# Patient Record
Sex: Male | Born: 1955 | Race: Black or African American | Hispanic: No | Marital: Single | Smoking: Former smoker
Health system: Southern US, Community
[De-identification: ages and names within clinical notes are randomized; demographics above are authoritative.]

## PROBLEM LIST (undated history)

## (undated) ENCOUNTER — Telehealth

## (undated) ENCOUNTER — Ambulatory Visit
Payer: PRIVATE HEALTH INSURANCE | Attending: Student in an Organized Health Care Education/Training Program | Primary: Student in an Organized Health Care Education/Training Program

## (undated) ENCOUNTER — Encounter
Attending: Student in an Organized Health Care Education/Training Program | Primary: Student in an Organized Health Care Education/Training Program

## (undated) ENCOUNTER — Encounter

## (undated) ENCOUNTER — Ambulatory Visit

## (undated) ENCOUNTER — Telehealth
Attending: Pharmacist Clinician (PhC)/ Clinical Pharmacy Specialist | Primary: Pharmacist Clinician (PhC)/ Clinical Pharmacy Specialist

## (undated) ENCOUNTER — Telehealth
Attending: Student in an Organized Health Care Education/Training Program | Primary: Student in an Organized Health Care Education/Training Program

## (undated) ENCOUNTER — Ambulatory Visit: Payer: PRIVATE HEALTH INSURANCE

## (undated) ENCOUNTER — Ambulatory Visit: Attending: Pharmacist | Primary: Pharmacist

## (undated) ENCOUNTER — Encounter
Attending: Pharmacist Clinician (PhC)/ Clinical Pharmacy Specialist | Primary: Pharmacist Clinician (PhC)/ Clinical Pharmacy Specialist

## (undated) ENCOUNTER — Ambulatory Visit: Payer: MEDICARE

## (undated) DIAGNOSIS — R569 Unspecified convulsions: Secondary | ICD-10-CM

---

## 1999-06-15 ENCOUNTER — Emergency Department (HOSPITAL_COMMUNITY): Admission: EM | Admit: 1999-06-15 | Discharge: 1999-06-15 | Payer: Self-pay | Admitting: Emergency Medicine

## 1999-07-27 ENCOUNTER — Encounter: Payer: Self-pay | Admitting: Emergency Medicine

## 1999-07-27 ENCOUNTER — Emergency Department (HOSPITAL_COMMUNITY): Admission: EM | Admit: 1999-07-27 | Discharge: 1999-07-27 | Payer: Self-pay | Admitting: Emergency Medicine

## 1999-11-28 ENCOUNTER — Encounter: Payer: Self-pay | Admitting: Emergency Medicine

## 1999-11-28 ENCOUNTER — Emergency Department (HOSPITAL_COMMUNITY): Admission: EM | Admit: 1999-11-28 | Discharge: 1999-11-28 | Payer: Self-pay | Admitting: Emergency Medicine

## 1999-11-29 ENCOUNTER — Inpatient Hospital Stay (HOSPITAL_COMMUNITY): Admission: EM | Admit: 1999-11-29 | Discharge: 1999-11-30 | Payer: Self-pay | Admitting: *Deleted

## 2001-10-21 ENCOUNTER — Inpatient Hospital Stay (HOSPITAL_COMMUNITY): Admission: EM | Admit: 2001-10-21 | Discharge: 2001-10-25 | Payer: Self-pay | Admitting: Psychiatry

## 2008-03-18 ENCOUNTER — Emergency Department (HOSPITAL_COMMUNITY): Admission: EM | Admit: 2008-03-18 | Discharge: 2008-03-18 | Payer: Self-pay | Admitting: Emergency Medicine

## 2008-03-24 ENCOUNTER — Emergency Department (HOSPITAL_COMMUNITY): Admission: EM | Admit: 2008-03-24 | Discharge: 2008-03-24 | Payer: Self-pay | Admitting: Emergency Medicine

## 2008-04-08 ENCOUNTER — Inpatient Hospital Stay (HOSPITAL_COMMUNITY): Admission: EM | Admit: 2008-04-08 | Discharge: 2008-04-12 | Payer: Self-pay | Admitting: Emergency Medicine

## 2008-11-12 ENCOUNTER — Emergency Department (HOSPITAL_COMMUNITY): Admission: EM | Admit: 2008-11-12 | Discharge: 2008-11-12 | Payer: Self-pay | Admitting: Emergency Medicine

## 2008-11-29 ENCOUNTER — Emergency Department (HOSPITAL_COMMUNITY): Admission: EM | Admit: 2008-11-29 | Discharge: 2008-11-29 | Payer: Self-pay | Admitting: Emergency Medicine

## 2009-06-18 ENCOUNTER — Emergency Department (HOSPITAL_COMMUNITY): Admission: EM | Admit: 2009-06-18 | Discharge: 2009-06-18 | Payer: Self-pay | Admitting: Emergency Medicine

## 2009-08-31 ENCOUNTER — Emergency Department (HOSPITAL_COMMUNITY): Admission: EM | Admit: 2009-08-31 | Discharge: 2009-08-31 | Payer: Self-pay | Admitting: Emergency Medicine

## 2009-12-21 ENCOUNTER — Emergency Department (HOSPITAL_COMMUNITY): Admission: EM | Admit: 2009-12-21 | Discharge: 2009-12-21 | Payer: Self-pay | Admitting: Emergency Medicine

## 2010-06-27 DEATH — deceased

## 2010-08-11 LAB — ETHANOL: Alcohol, Ethyl (B): 189 mg/dL — ABNORMAL HIGH (ref 0–10)

## 2010-08-11 LAB — POCT I-STAT, CHEM 8
Glucose, Bld: 93 mg/dL (ref 70–99)
HCT: 44 % (ref 39.0–52.0)
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
## 2010-10-09 NOTE — Consult Note (Signed)
NAME:  TINY, RIETZ NO.:  [id number]   MEDICAL RECORD NO.:  [id number]          PATIENT TYPE:  INP   LOCATION:  1515                         FACILITY:  [facility]   PHYSICIAN:  Antonietta Breach, M.D.  DATE OF BIRTH:  [birth year]   DATE OF CONSULTATION:  04/11/2008  DATE OF DISCHARGE:                                 CONSULTATION   Mr. Vondrak is no longer having disorientation.  He is cooperative.  His mood and interests are intact.  Thought process is logical,  coherent, goal-directed.  Thought content with no thoughts of harming  himself.  No thoughts of harming others.  No delusions.  No  hallucinations.  He is wanting to leave the hospital.  He is not wanting  any psychiatric followup and he does have the ability to make a  consistent choice to differentiate between his options and their  associated risks versus benefits.  He can appreciate his condition and  he can reason well.  He does have the capacity to choose his discharge  environment.   He is not at risk to harm himself or others.   He does agree to call 9-1-1 for any thoughts of harming himself,  thoughts of harming others or distress.   ASSESSMENT:  1. Alcohol dependence.  2. Delirium is resolved.  3. Mr. Pettet is psychiatrically clear for discharge.  4. He agrees to call emergency services for any thoughts of harming      himself, thoughts of harming others or distress.  5. As he requests, the undersigned will ask the social worker to help      the patient find a place of residence.   If he changes his mind about alcohol rehab, would ask the social worker  to help facilitate his entry into a chemical dependence rehabilitation  program.      Antonietta Breach, M.D.  Electronically Signed     JW/MEDQ  D:  04/12/2008  T:  04/12/2008  Job:  [id number]
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
## 2010-10-12 NOTE — Discharge Summary (Signed)
[facility]  Patient:    Albert Ellis, Albert Ellis Visit Number: [id number] MRN: [id number]          Service Type: PSY Location: 300 0301 02 Attending Physician:  Rachael Fee Dictated by:   Reymundo Poll Dub Mikes, M.D. Admit Date:  10/21/2001 Discharge Date: 10/25/2001                             Discharge Summary  CHIEF COMPLAINT AND PRESENT ILLNESS:  This was the first admission to [facility] for this 55 year old male admitted due to depression and suicidal ideation.  History of alcohol abuse.  Long history of alcohol and cocaine use.  Feeling very hopeless, worthless, suicidal thoughts to walk into traffic, homicidal ideation towards no one in particular.  Positive auditory hallucinations for the past three months.  Positive visual hallucinations, seeing two guys.  None currently.  Reports decreased sleep.  Walks in the streets at night.  Has been homeless since December 2002.  Decreased appetite with 60-80 pound weight loss.  Smoking crack cocaine since he was in his 30s.  PAST PSYCHIATRIC HISTORY:  First time at [facility].  No previous treatment.  ALCOHOL/DRUG HISTORY:  Smokes cigarettes.  Started drinking when he was 17. Been drinking beer or wine, up to eight 40-ounce beers per day.  Drinks all day long.  History of blackouts.  No seizures.  Smoking marijuana and occasionally crack cocaine.  MEDICAL HISTORY:  Noncontributory.  PHYSICAL EXAMINATION:  Performed and failed to show any acute findings.  MENTAL STATUS EXAMINATION:  Well-nourished, alert, cooperative male casually dressed, somewhat unkempt.  Legs are moving during the interview.  Good eye contact.  Speech is clear, concrete response.  Mood is depressed.  Affect is depressed and anxious.  Thought processes are coherent.  No evidence of psychosis.  No auditory or visual hallucinations.  Cognition well-preserved.  ADMISSION DIAGNOSES: Axis I:    1. Alcohol  dependence.            2. Cocaine and marijuana abuse; rule out dependence.            3. Depressive disorder not otherwise specified.            4. Psychotic disorder not otherwise specified. Axis II:   No diagnosis. Axis III:  No diagnosis. Axis IV:   Moderate. Axis V:    Global Assessment of Functioning upon admission 35; highest Global            Assessment of Functioning in the last year 60.  LABORATORY DATA:  Within normal limits except SGOT was 42, SGPT was 47. Thyroid profile was within normal limits.  HOSPITAL COURSE:  He was admitted and started intensive individual and group psychotherapy.  He was detoxified using Librium.  He was given trazodone for sleep and Paxil for the craving.  He was also given Seroquel.  Paxil was increased to 25 mg daily and Seroquel 100 mg at bedtime.  As the hospitalization progressed, he started feeling better.  By October 25, 2001, he was stable, detoxed.  He will go to [facility] in [place].  Follow up with [facility].  Denied any suicidal ideation.  No homicidal ideation.  No active hallucinations.  Discharge was granted.  DISCHARGE DIAGNOSES: Axis I:    1. Alcohol dependence.            2. Cocaine and marijuana abuse.  3. Depressive disorder not otherwise specified. Axis II:   No diagnosis. Axis III:  No diagnosis. Axis IV:   Moderate. Axis V:    Global Assessment of Functioning upon discharge 60.  DISCHARGE MEDICATIONS: 1. Paxil CR 25 mg daily. 2. Seroquel 100 mg at night.  FOLLOW-UP:  [facility]. Dictated by:   Reymundo Poll Dub Mikes, M.D. Attending Physician:  Rachael Fee DD:  12/02/01 TD:  12/02/01 Job: [id number]

## 2010-10-12 NOTE — H&P (Signed)
[facility]  Patient:    Albert Ellis, Albert Ellis                       MRN: [id number] Adm. Date:  [id number] Attending:  Sandi Raveling CC:         ASAP to patients chart                         History and Physical  DATE OF BIRTH:  [birth year].  PROBLEM LIST: 1. Right lower extremity edema; rule out gout. 2. Dehydration. 3. Polysubstance abuse.    a. Alcohol abuse, 120-160 ounces of beer q.d., history of _____ in 1999.    b. Smoking, one pack q.d. x 15 years.    c. Marijuana use and cocaine use.  CHIEF COMPLAINT:  Right lower extremity pain.  HISTORY OF PRESENT ILLNESS:  Mr. Lipkin is a very pleasant 55 year old male who presents with a three-day history of progressive right lower extremity pain, erythema and slight warmth.  The patient was seen in the emergency department on 07/__/01, with signs/symptoms of cellulitis on the right lower extremity, ______ the first MTP, base of the right foot and ankle.  The patient received one shot of Ancef and he was discharged on Keflex.  He could not refill these prescriptions due to being unable to afford these medications.  In the ED, a needle aspirate of the area was obtained, the cultures are pending.  Mr. Edgell returned with worsening signs of pain and cellulitis.  He denies fever, chills, nausea, vomiting, diarrhea, malaise.  He denies chest pain, shortness of breath, PND or orthopnea.  No visual changes.  No presyncopal symptoms.  No abdominal complaints.  PAST MEDICAL HISTORY:  As problem list.  ALLERGIES:  None.  MEDICATIONS:  None.  The patient had two prescriptions for Vicodin and Keflex that he never refilled.  SOCIAL HISTORY:  The patient is divorced.  He has a daughter, age 46.  He works as a [employer]aff[employer].  He drinks as described in problem list.  He uses tobacco, cocaine nd marijuana, as described in the HPI.  FAMILY MEDICAL HISTORY:  Mother had diabetes, hypertension; she had a  stroke and heart attack in her early 56s.  No malignancy in the family.  REVIEW OF SYSTEMS:  As per HPI.  No focal weakness.  The patient describes an excruciating pain in the right lower extremity, starting in the right great toe and radiated up to the mid-tibial region.  PHYSICAL EXAMINATION:  VITAL SIGNS:  Temperature 98.1, blood pressure 137/83, heart rate 75, respirations 20.  HEENT:  Normocephalic, atraumatic.  Nonicteric sclerae.  Conjunctivae within normal limits.  PERRLA.  EOMI.  Funduscopic:  Exam negative for papilledema/hemorrhages. TMs within normal limits.  Slight dry mucous membranes.  Oropharynx clear.  NECK:  Supple; no JVD, no bruits, no adenopathy, no thyromegaly.  LUNGS:  Clear to auscultation bilaterally, without crackles, wheezes.  Good air  movement bilaterally.  CARDIAC:  Regular rate and rhythm without murmurs, rubs, gallops.  Normal S1, S2.  ABDOMEN:  Flat, nontender/nondistended; bowel sounds are present.  No hepato- splenomegaly.  No rebound, no guarding, no masses, no bruits.  ______:  Exam within normal limits.  RECTAL:  Exam deferred.  EXTREMITIES:  The right great toe has erythema extending from the base of the toes up to the ankle and then to the one-third of the distal tibial region in the medial aspect of  the extremity.  There is diffuse tenderness, more excruciating in the  first MTP.  There are area of bunions.  There is a less than 0.5 cm cut in the plantar aspect of the right great toe, without evidence of infection.  Warmth is present.  No evidence of drainage.  There is slight erythema and edema involving the base of the right toe and ankle.  Pulses are 2+ bilaterally.  No clubbing or cyanosis.  NEUROLOGIC:  Alert and oriented x 3.  Strength 5/5 in all extremities.  DTRs 3/5 in all extremities.  Cranial nerves II-XII intact.  Plantar reflexes downgoing bilaterally.  Sensory intact.  Questionable paraesthesias in the right  lower extremity.  LABORATORY DATA:  Pending.  ASSESSMENT/PLAN: 1. Right lower extremity cellulitis. Given the sign/symptom complex associated with the history of presentation, bacterial cellulitis is possible.  The basis for this admission is the fact the  patient failed outpatient therapy because he could not afford the antibiotics prescribed in the emergency department last night.  Also, given the location of  this infection, the micro-organisms involved in this type of cellulitis tend to be a mixed flora (_______________)  Also, about 10-12% of the cases involving the feet cellulitis are associated with Pseudomonal infection.  Finally, acute gout  with superimposed bacterial infection is possible.  Notice that the x-ray of extremities shows no ______; DJD is present in the right first MTP.  Will admit the patient to receive intravenous antibiotic therapy, empiric colchicine also will be started.  Right lower extremity will be elevated.  Pain  control will be provided with IV Demerol.  Blood cultures and lab data obtained in the emergency department still are pending.  If no clinical improvement, the right first MTP will be ______ under fluoroscopy to rule out acute gout versus septic  joint. 2. Dehydration. The physical examination is consistent with a mild degree of dehydration. Intravenous hydration therapy has been started in the ED.  Will follow the patients dehydration clinically.  Daily weights and fluid balance will be monitored throughout this hospital stay. 3. Polysubstance abuse. Given the amount of drinking this patient describes, and the timing of the last  drink (two days ago), alcohol withdrawal syndrome is an obvious risk.  Will start the patient on Librium, multivitamins and Ativan will be used intravenously as needed for agitation.  Information about AA was given to the patient.  I also spent about ten minutes talking about smoking cessation; the  patient declined help with patch or medication, like Wellbutrin.  Also, ______ information will be given prior to discharge to ______ rehab at this facility. DD:  11/29/99  TD:  11/29/99 Job: [id number]

## 2010-10-12 NOTE — H&P (Signed)
[facility]  Patient:    Albert Ellis, Albert Ellis Visit Number: [id number] MRN: [id number]          Service Type: PSY Location: 300 0301 02 Attending Physician:  Rachael Fee Dictated by:   Candi Leash. Orsini, N.P. Admit Date:  10/21/2001                     Psychiatric Admission Assessment  IDENTIFYING  55 year old single African-American male voluntarily admitted for depression, suicidal ideation, polysubstance abuse.  HISTORY OF PRESENT ILLNESS:  The patient presents with a history of alcohol abuse, long history of alcohol and cocaine abuse, feeling very hopeless and worthless, with suicidal thoughts to walk into traffic, and homicidal ideation towards no one in particular.  Reporting positive auditory hallucinations for the past 3 months, positive visual hallucinations, seeing "2 guys," but none currently.  He reports decreased sleep.  He states he walks the streets at night.  He has been homeless since 06-05-2001.  Also decreased appetite with a 60-80 pound weight loss.  He also admits that he has been smoking crack cocaine since he has been in his 30s.  He is currently reporting cravings at present and states he is motivated to get better.  PAST PSYCHIATRIC HISTORY:  First hospitalization at [facility], no other hospitalizations. no history of detox, no outpatient treatment, no history of a suicide attempt.  SOCIAL HISTORY:  He is a 55 year old single African-American male, divorced for over 10 years.  He has a 72 year old daughter.  He is homeless since 2002-06-05 after his mother died.  He has completed the 11/04/2022 grade.  FAMILY HISTORY:  None.  ALCOHOL DRUG HISTORY:  Patient smokes cigarettes.  He started drinking at the age of 14.  He has been drinking beer or wine, up to eight 40-ounce beers per day.  States he drinks all day long.  His last drink was 3 a.m. on Wednesday morning.  He has a history of blackouts with no  seizures, smoking marijuana and occasional crack cocaine since he has been in his 30s.  PAST MEDICAL HISTORY:  Primary care Marcelis Wissner is none.  Medical problems are none.  Medications are none.  He has never been on an antidepressant.  DRUG ALLERGIES:  No known allergies.  PHYSICAL EXAMINATION:  The patient is 179 pounds.  He is 5 feet 10 inches tall.  VITAL SIGNS:  97.7, 66 heart rate, 20 respirations, blood pressure 122/84.  GENERAL APPEARANCE:    Patient is a 55 year old African-American male in no acute distress.  He is well developed, appears his stated age, somewhat unkempt, alert and cooperative.  HEAD:  Normocephalic.  His hair is short, clean,  equally distributed.  EYES: His EOMs are intact bilaterally.  MOUTH:  His external ear canals are patent. No sinus tenderness or nasal discharge.  Tongue protrudes midline without tremor.  Mucosa is moist with fair dentition.  The patient has a lot of stain to his lower teeth.  He is edentulous in his upper teeth.  NECK:  Supple, no JVD, negative lymphadenopathy.  CHEST:  Clear to auscultation.  No adventitious sounds.  No cough.  HEART:  Regular rate and rhythm, without murmurs, gallops or rubs.  Carotid pulses are equal and adequate.  ABDOMEN:  Soft, nontender abdomen.  No CVA tenderness.  MUSCULOSKELETAL:  No joint swelling or deformity.  Good range of motion. Muscle strength and tone is equal bilaterally.  SKIN:  Warm and dry.  Strong bilateral radial pulses.  Nail beds are pink, clean with good capillary refill.  No rashes or lacerations noted.  NEUROLOGIC:  Cranial nerves are grossly intact.  Good grip strength bilaterally.  No involuntary movements.  Cerebellar functions were intact, with heel-to-shin, normal alternating movements.  LABORATORY DATA:  Alcohol level less than 5. SGOT is elevated at 42.  SGPT is 47.  CBC:  MCVs are low at 74.  RDWs are elevated at 16.  MENTAL STATUS EXAMINATION:  He is an alert,  middle-aged, cooperative African-American male, casually dressed, somewhat unkempt.  Legs are moving during the interview.  Good eye contact.  Speech is clear, concrete response. Mood is depressed, affect is depressed and anxious.  Thought process are coherent.  No evidence of psychosis.  No auditory or visual hallucinations. No suicidal or homicidal ideations.  Cognitive function is intact.  Memory is fair.  Judgment and insight is fair.  Appears to have somewhat of a limited intellectual ability.  ADMISSION DIAGNOSES: Axis I:    1. Depression not otherwise specified.            2. Alcohol abuse, rule out dependence.            3. Cocaine abuse, rule out dependence. Axis II:   Deferred. Axis III:  None. Axis IV:   Problems with primary support group, occupation, housing, other            psychosocial problems. Axis V:    Current 35, estimated this past year 52.  INITIAL PLAN OF CARE:  Voluntary admission for depression, suicidal ideation, alcohol abuse, and polysubstance abuse.  Contract for safety, check every 15 minutes.  Will initiate the low-dose Librium protocol, have trazodone available for sleep.  We will initiate Paxil for depression and anxiety. Risks and benefits were discussed with patient, patient agrees.  Will encourage fluids, will detox safely.  Goal is to stabilize mood and thinking so patient can be safe, to be medication compliant, to remain alcohol and drug free, to follow up with mental health and AA/NA meetings.  Case worker is to look at housing arrangements after discharge.  TENTATIVE LENGTH OF STAY:  4-6 days. Dictated by:   Candi Leash. Orsini, N.P. Attending Physician:  Rachael Fee DD:  10/22/01 TD:  10/24/01 Job: [id number]

## 2011-02-26 LAB — CBC
MCHC: 32.3
MCV: 74.7 — ABNORMAL LOW
Platelets: 193
Platelets: 247
RDW: 16 — ABNORMAL HIGH
RDW: 16.1 — ABNORMAL HIGH
WBC: 6.7

## 2011-02-26 LAB — CROSSMATCH: ABO/RH(D): A POS

## 2011-02-26 LAB — BASIC METABOLIC PANEL
BUN: 6
CO2: 29
Calcium: 9.5
Chloride: 101
Creatinine, Ser: 0.95
GFR calc Af Amer: >60
Glucose, Bld: 103 — ABNORMAL HIGH

## 2011-02-26 LAB — HEPATITIS C ANTIBODY: HCV Ab: REACTIVE — AB

## 2011-02-26 LAB — COMPREHENSIVE METABOLIC PANEL
BUN: 12
CO2: 25
GFR calc non Af Amer: >60
Glucose, Bld: 86
Potassium: 3.8

## 2011-02-26 LAB — POCT I-STAT, CHEM 8
BUN: 16
Calcium, Ion: 1.09 — ABNORMAL LOW
Chloride: 109
Glucose, Bld: 79
HCT: 37 — ABNORMAL LOW
Hemoglobin: 12.6 — ABNORMAL LOW
TCO2: 23

## 2011-02-26 LAB — PROTIME-INR: Prothrombin Time: 14.1

## 2011-02-26 LAB — HEPATIC FUNCTION PANEL
ALT: 40
Albumin: 2.8 — ABNORMAL LOW
Albumin: 3.4 — ABNORMAL LOW
Alkaline Phosphatase: 51
Total Protein: 5.7 — ABNORMAL LOW
Total Protein: 7.5

## 2011-02-26 LAB — ABO/RH: ABO/RH(D): A POS

## 2011-02-26 LAB — ETHANOL: Alcohol, Ethyl (B): 30 — ABNORMAL HIGH

## 2011-02-26 LAB — MAGNESIUM: Magnesium: 1.6

## 2011-02-26 LAB — RAPID URINE DRUG SCREEN, HOSP PERFORMED: Opiates: NOT DETECTED

## 2011-02-26 LAB — HIV ANTIBODY (ROUTINE TESTING W REFLEX): HIV: NONREACTIVE

## 2018-02-16 NOTE — Congregational Nurse Program (Signed)
Client requesting resources.  Referred to [facility] intern to assist with obtaining ID, intake to see Lavinia SharpsMary Ann Placey NP and Social Services for his SSI

## 2018-10-11 ENCOUNTER — Emergency Department (HOSPITAL_COMMUNITY)
Admission: EM | Admit: 2018-10-11 | Discharge: 2018-10-11 | Attending: Emergency Medicine | Admitting: Emergency Medicine

## 2018-10-11 ENCOUNTER — Encounter (HOSPITAL_COMMUNITY): Payer: Self-pay

## 2018-10-11 ENCOUNTER — Other Ambulatory Visit: Payer: Self-pay

## 2018-10-11 ENCOUNTER — Emergency Department (HOSPITAL_COMMUNITY)

## 2018-10-11 DIAGNOSIS — M549 Dorsalgia, unspecified: Secondary | ICD-10-CM | POA: Insufficient documentation

## 2018-10-11 DIAGNOSIS — Y92149 Unspecified place in prison as the place of occurrence of the external cause: Secondary | ICD-10-CM | POA: Diagnosis not present

## 2018-10-11 DIAGNOSIS — Z23 Encounter for immunization: Secondary | ICD-10-CM | POA: Diagnosis not present

## 2018-10-11 DIAGNOSIS — M25512 Pain in left shoulder: Secondary | ICD-10-CM | POA: Insufficient documentation

## 2018-10-11 DIAGNOSIS — Z79899 Other long term (current) drug therapy: Secondary | ICD-10-CM | POA: Diagnosis not present

## 2018-10-11 DIAGNOSIS — S0181XA Laceration without foreign body of other part of head, initial encounter: Secondary | ICD-10-CM

## 2018-10-11 DIAGNOSIS — Y9389 Activity, other specified: Secondary | ICD-10-CM | POA: Diagnosis not present

## 2018-10-11 DIAGNOSIS — W19XXXA Unspecified fall, initial encounter: Secondary | ICD-10-CM

## 2018-10-11 DIAGNOSIS — W01198A Fall on same level from slipping, tripping and stumbling with subsequent striking against other object, initial encounter: Secondary | ICD-10-CM | POA: Diagnosis not present

## 2018-10-11 DIAGNOSIS — Z87891 Personal history of nicotine dependence: Secondary | ICD-10-CM | POA: Diagnosis not present

## 2018-10-11 DIAGNOSIS — Y998 Other external cause status: Secondary | ICD-10-CM | POA: Diagnosis not present

## 2018-10-11 DIAGNOSIS — S0990XA Unspecified injury of head, initial encounter: Secondary | ICD-10-CM | POA: Diagnosis present

## 2018-10-11 DIAGNOSIS — Z8669 Personal history of other diseases of the nervous system and sense organs: Secondary | ICD-10-CM | POA: Insufficient documentation

## 2018-10-11 HISTORY — DX: Unspecified convulsions: R56.9

## 2018-10-11 LAB — CBC WITH DIFFERENTIAL/PLATELET
Abs Immature Granulocytes: 0.02 10*3/uL (ref 0.00–0.07)
Basophils Absolute: 0 10*3/uL (ref 0.0–0.1)
Basophils Relative: 0 %
Eosinophils Absolute: 0 10*3/uL (ref 0.0–0.5)
Eosinophils Relative: 0 %
HCT: 41.9 % (ref 39.0–52.0)
Hemoglobin: 13.1 g/dL (ref 13.0–17.0)
Immature Granulocytes: 0 %
Lymphocytes Relative: 24 %
Lymphs Abs: 1.3 10*3/uL (ref 0.7–4.0)
MCH: 24.3 pg — ABNORMAL LOW (ref 26.0–34.0)
MCHC: 31.3 g/dL (ref 30.0–36.0)
MCV: 77.9 fL — ABNORMAL LOW (ref 80.0–100.0)
Monocytes Absolute: 0.7 10*3/uL (ref 0.1–1.0)
Monocytes Relative: 13 %
Neutro Abs: 3.4 10*3/uL (ref 1.7–7.7)
Neutrophils Relative %: 63 %
Platelets: 111 10*3/uL — ABNORMAL LOW (ref 150–400)
RBC: 5.38 MIL/uL (ref 4.22–5.81)
RDW: 15.1 % (ref 11.5–15.5)
WBC: 5.4 10*3/uL (ref 4.0–10.5)
nRBC: 0 % (ref 0.0–0.2)

## 2018-10-11 LAB — BASIC METABOLIC PANEL
Anion gap: 8 (ref 5–15)
BUN: 18 mg/dL (ref 8–23)
CO2: 28 mmol/L (ref 22–32)
Calcium: 8.8 mg/dL — ABNORMAL LOW (ref 8.9–10.3)
Chloride: 104 mmol/L (ref 98–111)
Creatinine, Ser: 1.11 mg/dL (ref 0.61–1.24)
GFR calc Af Amer: >60 mL/min (ref >60)
GFR calc non Af Amer: >60 mL/min (ref >60)
Glucose, Bld: 114 mg/dL — ABNORMAL HIGH (ref 70–99)
Potassium: 3.3 mmol/L — ABNORMAL LOW (ref 3.5–5.1)
Sodium: 140 mmol/L (ref 135–145)

## 2018-10-11 LAB — VALPROIC ACID LEVEL: Valproic Acid Lvl: 98 ug/mL (ref 50.0–100.0)

## 2018-10-11 LAB — CBG MONITORING, ED: Glucose-Capillary: 97 mg/dL (ref 70–99)

## 2018-10-11 MED ORDER — LIDOCAINE HCL 2 % IJ SOLN
INTRAMUSCULAR | Status: AC
  Administered 2018-10-11: 100 mg
  Filled 2018-10-11: qty 20

## 2018-10-11 MED ORDER — ALUM & MAG HYDROXIDE-SIMETH 200-200-20 MG/5ML PO SUSP
30.0000 mL | Freq: Once | ORAL | Status: AC
  Administered 2018-10-11: 30 mL via ORAL
  Filled 2018-10-11: qty 30

## 2018-10-11 MED ORDER — TETANUS-DIPHTH-ACELL PERTUSSIS 5-2.5-18.5 LF-MCG/0.5 IM SUSP
0.5000 mL | Freq: Once | INTRAMUSCULAR | Status: AC
  Administered 2018-10-11: 0.5 mL via INTRAMUSCULAR
  Filled 2018-10-11: qty 0.5

## 2018-10-11 MED ORDER — LIDOCAINE-EPINEPHRINE-TETRACAINE (LET) SOLUTION
3.0000 mL | Freq: Once | NASAL | Status: DC
  Filled 2018-10-11: qty 3

## 2018-10-11 NOTE — Discharge Instructions (Addendum)
You were seen in the ED after a slip and fall after using the bathroom.   CT head, labs, and other work up in ED was normal. You refused tetanus shot because you just had it.   Stitches need to come out in 7 days  Laceration to the forehead was repair with stitches.  Keep this clean and dry. Wash with clean water and soap, tap dry and apply a thin layer of antibiotic ointment at least twice daily. Monitor for and return for redness, swelling, pus, fever.

## 2018-10-11 NOTE — ED Triage Notes (Signed)
He is currently an [facility]. He was found by staff there to be prostrate near the shower, presumably having fallen, with a lac. Above right eye brow. He mentions he has a hx of seizures and takes Depakote for same. He arrives in ED in a rigid C-collar awake, alert and oriented x 4 with clear speech. [employer] [facility] are with him at all times. He is calm and comfortable.

## 2018-10-11 NOTE — ED Provider Notes (Signed)
[facility]-EMERGENCY DEPT Provider Note   CSN: [id number] Arrival date & time: 10/11/18  7703    History   Chief Complaint Chief Complaint  Patient presents with   Fall   Head Laceration    HPI Albert Ellis is a 63 y.o. male with ho seizures brought to ED from [facility] for evaluation of fall.  History obtained from patient. States he stood up from the toilet and "slipped" and fell to the ground landing on the ground and striking his right forehead on some nearby furniture.  He broke out in sweats and felt dizzy after the fall which has resolved.  Apparently was found on the ground awake per officers.  Has a bleeding right forehead laceration that is locally tender a swollen. Reports associated left shoulder and back pain. He denies prodromal palpitations, light-headedness, CP, SOB prior to slip and fall.  He denies associated LOC, headache, vision changes, neck pain, difficulty talking or walking, one sided numbness or weakness unilaterally after the fall.  No interventions. No alleviating and aggravating factors.  Has been compliant with depakote for seizures.  Does not think he had a seizure, denies typical post seizure body aches, tongue biting, incontinence.  HPI  Past Medical History:  Diagnosis Date   Seizures (HCC)     There are no active problems to display for this patient.   ** The histories are not reviewed yet. Please review them in the "History" navigator section and refresh this SmartLink.      Home Medications    Prior to Admission medications   Medication Sig Start Date End Date Taking? Authorizing Provider  atorvastatin (LIPITOR) 20 MG tablet Take 20 mg by mouth daily at 6 PM.   Yes [provider]  calcium carbonate (TUMS - DOSED IN MG ELEMENTAL CALCIUM) 500 MG chewable tablet Chew 1 tablet by mouth 2 (two) times daily with a meal.   Yes [provider]  divalproex (DEPAKOTE) 500 MG DR tablet Take 500 mg by  mouth 2 (two) times daily.   Yes [provider]  hydrochlorothiazide (HYDRODIURIL) 25 MG tablet Take 25 mg by mouth daily.   Yes [provider]  mirtazapine (REMERON) 7.5 MG tablet Take 7.5 mg by mouth at bedtime.   Yes [provider]  OLANZapine (ZYPREXA) 15 MG tablet Take 30 mg by mouth at bedtime.   Yes [provider]    Family History No family history on file.  Social History Social History   Tobacco Use   Smoking status: Former Smoker   Smokeless tobacco: Never Used  Substance Use Topics   Alcohol use: Not Currently   Drug use: Not on file     Allergies   Patient has no known allergies.   Review of Systems Review of Systems  Musculoskeletal: Positive for arthralgias.  Skin: Positive for wound.  All other systems reviewed and are negative.    Physical Exam Updated Vital Signs BP (!) 130/101    Pulse (!) 107    Temp 97.8 F (36.6 C) (Oral)    Resp (!) 31    SpO2 99%   Physical Exam Vitals signs and nursing note reviewed.  Constitutional:      Appearance: He is well-developed.     Comments: Awake me, pleasant. Joking with staff.   HENT:     Head: Normocephalic.     Comments: Approx 5 cm straight laceration to right forehead with local contusion/edema, oozing blood.  No other nasal, scalp  bone tenderness.     Ears:     Comments: TMs normal w/o hemotympanum. No Battle's sign    Nose: Nose normal.  Eyes:     Conjunctiva/sclera: Conjunctivae normal.     Comments: No Racoon's eyes.  Neck:     Musculoskeletal: Normal range of motion.     Comments: c-spine: In cervical collar. No midline tenderness. Mild left sided muscular tenderness. TTP to left trapezius. Trachea midline Cardiovascular:     Rate and Rhythm: Normal rate and regular rhythm.     Heart sounds: Normal heart sounds.     Comments: 1+ radial and DP pulses bilaterally. No LE edema or calf tenderness.  Pulmonary:     Effort: Pulmonary effort is normal.      Breath sounds: Normal breath sounds.     Comments: Mild left mid axillary chest wall tenderness. No obvious contusion. Lungs CTAP.   Chest:     Chest wall: Tenderness present.  Abdominal:     General: Bowel sounds are normal.     Palpations: Abdomen is soft.     Tenderness: There is no abdominal tenderness.  Musculoskeletal: Normal range of motion.        General: Tenderness present.     Comments: LUE: mild lateral/posterior deltoid tenderness. Mild diffuse scapular tenderness. Full passive ROM of left shoulder with mild pain, spontaneously moves this extremity.  Pain somewhat distractible. No TTP to [facility] or [place] joint, clavicle, sternum TL spine: no midline or paraspinal muscle tenderness Pelvis: no Ap/L instability with compression. Full ROM of hips without pain.   Skin:    General: Skin is warm and dry.     Capillary Refill: Capillary refill takes less than 2 seconds.     Findings: Laceration present.     Comments: 5 cm laceration as above   Neurological:     Mental Status: He is alert.     Comments:  Alert and oriented to self, place, time and event.  Speech is fluent without dysarthria or dysphasia. Strength 5/5 in upper/lower extremities.   Sensation to light touch intact in face, hands and feet. Sits on side of the bed without truncal sway No pronator drift. No leg drop. Normal finger-to-nose.  CN I not tested CN II grossly intact visual fields bilaterally. Unable to visualize posterior eye. CN III, IV, VI PEERL and EOMs intact bilaterally CN V light touch intact in all 3 divisions of trigeminal nerve CN VII facial movements symmetric CN VIII not tested CN IX, X no uvula deviation, symmetric rise of soft palate  CN XI 5/5 SCM and trapezius strength bilaterally  CN XII Midline tongue protrusion, symmetric L/R movements  Psychiatric:        Behavior: Behavior normal.      ED Treatments / Results  Labs (all labs ordered are listed, but only abnormal results are  displayed) Labs Reviewed  BASIC METABOLIC PANEL - Abnormal; Notable for the following components:      Result Value   Potassium 3.3 (*)    Glucose, Bld 114 (*)    Calcium 8.8 (*)    All other components within normal limits  CBC WITH DIFFERENTIAL/PLATELET - Abnormal; Notable for the following components:   MCV 77.9 (*)    MCH 24.3 (*)    Platelets 111 (*)    All other components within normal limits  VALPROIC ACID LEVEL  CBG MONITORING, ED    EKG EKG Interpretation  Date/Time:  Sunday Oct 11 2018 10:13:10 EDT Ventricular Rate:  80 PR Interval:    QRS Duration: 103 QT Interval:  386 QTC Calculation: 446 R Axis:   -22 Text Interpretation:  Sinus rhythm Borderline left axis deviation Abnormal R-wave progression, early transition Nonspecific T abnormalities, lateral leads Confirmed by Loren Racer ([id number]) on 10/11/2018 12:30:58 PM   Radiology Dg Ribs Unilateral W/chest Left  Result Date: 10/11/2018 CLINICAL DATA:  Post fall, now with left rib pain EXAM: LEFT RIBS AND CHEST - 3+ VIEW COMPARISON:  12/21/2009; left shoulder and scapular radiographs-earlier same day FINDINGS: Unchanged cardiac silhouette. Apparent widening of the mediastinal structures may be attributable to AP projection and portable technique. Atherosclerotic plaque within the thoracic aorta. No pleural effusion or pneumothorax.  No evidence of edema. No definite displaced left-sided rib fractures. Regional soft tissues appear normal. No radiopaque foreign body. There are 2 punctate opacities overlying the left upper abdominal quadrant with dominant opacity measuring approximately 1 cm, nonspecific though potentially representative of renal stones. IMPRESSION: 1. No definite displaced left-sided rib fractures. No radiopaque foreign body. 2. Apparent widening of the mediastinum may be attributable to AP projection and portable technique. If there is clinical concern for a mediastinal injury, further evaluation could be  performed with PA and lateral chest radiograph and/or the acquisition of a contrast-enhanced chest CT as indicated. 3. Otherwise, no acute cardiopulmonary disease. 4. Potential left-sided nephrolithiasis. Electronically Signed   By: Simonne Come M.D.   On: 10/11/2018 12:54   Dg Scapula Left  Result Date: 10/11/2018 CLINICAL DATA:  Post fall, now with left shoulder and scapular pain. EXAM: LEFT SCAPULA - 2+ VIEWS COMPARISON:  Left shoulder radiographs-earlier same day FINDINGS: No fracture or dislocation. Mild degenerative change the left glenohumeral joint with joint space loss, subchondral sclerosis osteophytosis. Normal appearance of the left acromioclavicular joint. No evidence of calcific tendinitis. Limited visualization adjacent thorax is normal. Regional soft tissues appear normal. No radiopaque foreign body. IMPRESSION: No definite displaced scapular fracture. Electronically Signed   By: Simonne Come M.D.   On: 10/11/2018 12:50   Ct Head Wo Contrast  Result Date: 10/11/2018 CLINICAL DATA:  Post fall in shower.  History of seizures. EXAM: CT HEAD WITHOUT CONTRAST CT CERVICAL SPINE WITHOUT CONTRAST TECHNIQUE: Multidetector CT imaging of the head and cervical spine was performed following the standard protocol without intravenous contrast. Multiplanar CT image reconstructions of the cervical spine were also generated. COMPARISON:  12/21/2009 FINDINGS: CT HEAD FINDINGS Brain: Mild atrophy with sulcal prominence centralized volume loss with commensurate ex vacuo dilatation of the ventricular system. Gray-white differentiation is otherwise well maintained without CT evidence of acute large territory infarct. No intraparenchymal or extra-axial mass or hemorrhage. Unchanged size and configuration of the ventricles and the basilar cisterns. Note is again made of a septum cavum pellucidum. No midline shift. Vascular: Intracranial atherosclerosis. Skull: No displaced calvarial fracture with special attention paid  to the right frontal calvarium. Sinuses/Orbits: Limited visualization the paranasal sinuses and mastoid air cells is normal. Other: There is an apparent laceration about the right-side of the forehead with associated adjacent soft tissue swelling (images 28 through 33, series 3). No associated radiopaque foreign body _________________________________________________________ CT CERVICAL SPINE FINDINGS Alignment: C1 to the superior endplate of T1 is imaged. There is straightening and slight reversal of the expected cervical lordosis with mild kyphosis centered about the C4-C5 articulation. Skull base and vertebrae: The dens is normally positioned between the lateral masses of C1. Mild degenerative change of the atlantodental articulation with note made of an approximately 0.6 cm ossicle  inferior to anterior ring of C1, unchanged compared to the 2011 examination. 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 Soft tissues and spinal canal: Prevertebral soft tissues are normal. Disc levels: Moderate multilevel cervical spine DDD, worse at C4-C5, C5-C6 and C6-C7 with disc space height loss, endplate irregularity and sclerosis. Upper chest: Limited visualization of the lung apices demonstrates biapical paraseptal emphysematous change with bullous formation about the medial aspect of the right lung apex, similar to the 2011 examination. Other: Regional soft tissues appear normal. Normal noncontrast appearance of the thyroid gland. No bulky cervical lymphadenopathy on this noncontrast examination. IMPRESSION: Head CT Impression: 1. Soft tissue laceration about the right-side of the forehead without associated radiopaque foreign body, displaced calvarial fracture or acute intracranial process. 2. Similar findings of atrophy and centralized volume  loss. Cervical Spine CT Impression: 1. No fracture or static subluxation of the cervical spine. 2. Moderate multilevel cervical spine DDD, worse at C4-C5, C5-C6 and C6-C7, progressed compared to the 11/2009 examination. 3. 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 4.  Emphysema (ICD10-J43.9). Electronically Signed   By: Simonne Come M.D.   On: 10/11/2018 12:48   Ct Cervical Spine Wo Contrast  Result Date: 10/11/2018 CLINICAL DATA:  Post fall in shower.  History of seizures. EXAM: CT HEAD WITHOUT CONTRAST CT CERVICAL SPINE WITHOUT CONTRAST TECHNIQUE: Multidetector CT imaging of the head and cervical spine was performed following the standard protocol without intravenous contrast. Multiplanar CT image reconstructions of the cervical spine were also generated. COMPARISON:  12/21/2009 FINDINGS: CT HEAD FINDINGS Brain: Mild atrophy with sulcal prominence centralized volume loss with commensurate ex vacuo dilatation of the ventricular system. Gray-white differentiation is otherwise well maintained without CT evidence of acute large territory infarct. No intraparenchymal or extra-axial mass or hemorrhage. Unchanged size and configuration of the ventricles and the basilar cisterns. Note is again made of a septum cavum pellucidum. No midline shift. Vascular: Intracranial atherosclerosis. Skull: No displaced calvarial fracture with special attention paid to the right frontal calvarium. Sinuses/Orbits: Limited visualization the paranasal sinuses and mastoid air cells is normal. Other: There is an apparent laceration about the right-side of the forehead with associated adjacent soft tissue swelling (images 28 through 33, series 3). No associated radiopaque foreign body _________________________________________________________ CT CERVICAL SPINE FINDINGS Alignment: C1 to the superior endplate of T1 is imaged. There is straightening and slight reversal of the expected  cervical lordosis with mild kyphosis centered about the C4-C5 articulation. Skull base and vertebrae: The dens is normally positioned between the lateral masses of C1. Mild degenerative change of the atlantodental articulation with note made of an approximately 0.6 cm ossicle inferior to anterior ring of C1, unchanged compared to the 2011 examination. 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 Soft tissues and spinal canal: Prevertebral soft tissues are normal. Disc levels: Moderate multilevel cervical spine DDD, worse at C4-C5, C5-C6 and C6-C7 with disc space height loss, endplate irregularity and sclerosis. Upper chest: Limited visualization of the lung apices demonstrates biapical paraseptal emphysematous change with bullous formation about the medial aspect of the right lung apex, similar to the 2011 examination. Other: Regional soft tissues appear normal. Normal noncontrast appearance of the thyroid gland.  No bulky cervical lymphadenopathy on this noncontrast examination. IMPRESSION: Head CT Impression: 1. Soft tissue laceration about the right-side of the forehead without associated radiopaque foreign body, displaced calvarial fracture or acute intracranial process. 2. Similar findings of atrophy and centralized volume loss. Cervical Spine CT Impression: 1. No fracture or static subluxation of the cervical spine. 2. Moderate multilevel cervical spine DDD, worse at C4-C5, C5-C6 and C6-C7, progressed compared to the 11/2009 examination. 3. 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 4.  Emphysema (ICD10-J43.9). Electronically Signed   By: Simonne Come M.D.   On: 10/11/2018 12:48   Dg Shoulder Left  Result Date: 10/11/2018 CLINICAL DATA:  Post fall, now with left shoulder pain  EXAM: LEFT SHOULDER - 2+ VIEW COMPARISON:  Left scapular radiographs-earlier same day FINDINGS: No fracture or dislocation. Mild degenerative change the left glenohumeral joint with joint space loss, subchondral sclerosis osteophytosis. Normal appearance of the left acromioclavicular joint. No evidence of calcific tendinitis. Limited visualization adjacent thorax demonstrates atherosclerotic plaque within the thoracic aorta regional soft tissues appear normal. No radiopaque foreign body. IMPRESSION: 1. No fracture or dislocation. 2. Mild degenerative change of the left glenohumeral joint. Electronically Signed   By: Simonne Come M.D.   On: 10/11/2018 12:51    Procedures .Marland KitchenLaceration Repair Date/Time: 10/11/2018 5:44 PM Performed by: Liberty Handy, PA-C Authorized by: Liberty Handy, PA-C   Consent:    Consent obtained:  Verbal   Consent given by:  Patient   Risks discussed:  Infection, need for additional repair, pain, poor cosmetic result and poor wound healing   Alternatives discussed:  No treatment and delayed treatment Universal protocol:    Procedure explained and questions answered to patient or proxy's satisfaction: yes     Relevant documents present and verified: yes     Test results available and properly labeled: yes     Imaging studies available: yes     Required blood products, implants, devices, and special equipment available: yes     Site/side marked: yes     Immediately prior to procedure, a time out was called: yes     Patient identity confirmed:  Verbally with patient Anesthesia (see MAR for exact dosages):    Anesthesia method:  Local infiltration   Local anesthetic:  Lidocaine 2% w/o epi Laceration details:    Location:  Scalp   Scalp location:  Frontal   Length (cm):  5 Repair type:    Repair type:  Intermediate Pre-procedure details:    Preparation:  Patient was prepped and draped in usual sterile fashion and imaging obtained to evaluate for foreign  bodies Exploration:    Hemostasis achieved with:  Direct pressure   Wound exploration: wound explored through full range of motion and entire depth of wound probed and visualized     Wound extent: no nerve damage noted and no underlying fracture noted     Contaminated: no   Treatment:    Area cleansed with:  Betadine   Amount of cleaning:  Standard   Irrigation solution:  Sterile saline   Irrigation volume:  200 cc   Irrigation method:  Syringe   Visualized foreign bodies/material removed: no   Skin repair:    Repair method:  Sutures   Suture size:  5-0   Wound skin closure material used: ethilon.   Suture technique:  Simple interrupted   Number of sutures:  5 Approximation:    Approximation:  Close Post-procedure details:  Dressing:  Antibiotic ointment and non-adherent dressing   Patient tolerance of procedure:  Tolerated well, no immediate complications   (including critical care time)  Medications Ordered in ED Medications  lidocaine-EPINEPHrine-tetracaine (LET) solution (3 mLs Topical Not Given 10/11/18 1358)  Tdap (BOOSTRIX) injection 0.5 mL (0.5 mLs Intramuscular Given 10/11/18 1357)  lidocaine (XYLOCAINE) 2 % (with pres) injection (100 mg  Given by Other 10/11/18 1358)  alum & mag hydroxide-simeth (MAALOX/MYLANTA) 200-200-20 MG/5ML suspension 30 mL (30 mLs Oral Given 10/11/18 1511)     Initial Impression / Assessment and Plan / ED Course  I have reviewed the triage vital signs and the nursing notes.  Pertinent labs & imaging results that were available during my care of the patient were reviewed by me and considered in my medical decision making (see chart for details).  Clinical Course as of Oct 11 1743  Sun Oct 11, 2018  1332 Apparent widening of the mediastinum may be attributable to AP projection and portable technique. If there is clinical concern for a mediastinal injury, further evaluation could be performed with PA and lateral chest radiograph and/or the  acquisition of a contrast-enhanced chest CT as indicated.  DG Ribs Unilateral W/Chest Left [CG]  1332 Mild degenerative change of the left glenohumeral joint  DG Shoulder Left [CG]  1333 Negative  DG Scapula Left [CG]  1333 1. Soft tissue laceration about the right-side of the forehead without associated radiopaque foreign body, displaced calvarial fracture or acute intracranial process. 2. Similar findings of atrophy and centralized volume loss.  Cervical Spine CT Impression: 1. No fracture or static subluxation of the cervical spine. 2. Moderate multilevel cervical spine DDD, worse at C4-C5, C5-C6 and C6-C7, progressed compared to the 11/2009 examination. 3. 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 4. Emphysema (ICD10-J43.9).  CT HEAD WO CONTRAST [CG]  1742 Potassium(!): 3.3 [CG]    Clinical Course User Index [CG] Liberty HandyGibbons, Florence Antonelli J, PA-C      63 y.o. yo male here after unwitnessed mechanical fall.  Reports pain to forehead laceration, neck, left shoulder.    HD stable on arrival.  Alert. No obvious signs of significant TL spine, pelvis injury. Given symptoms, exam will obtain imaging based on exam/pain.  No prodromal symptoms prior to fall and it seems it was mechanical and not cardiac/CNS, seizure related.  Imaging and labs pending.  Final Clinical Impressions(s) / ED Diagnoses   1740: Work up reassuring. K 3.3.  Imaging without acute findings.  Laceration repaired without immediate complications. [facility] and suture removal in 7 days, symptomatic pain control for MSk soft tissue pain of left shoulder. .  Final diagnoses:  Fall, initial encounter  Laceration of forehead, initial encounter    ED Discharge Orders    None       Jerrell MylarGibbons, Vasilis Luhman J, PA-C 10/11/18 1745    Loren RacerYelverton, Rodd, MD 10/12/18 1620

## 2018-10-11 NOTE — ED Notes (Signed)
He stands and ambulates slowly and capably without assist. He requests an "antacid", which we provide and give to him.

## 2018-10-11 NOTE — ED Notes (Signed)
Bed: MN81 Expected date:  Expected time:  Means of arrival:  Comments: 63 yo fall w/ LOC, in custody, head lac

## 2021-05-06 IMAGING — CR LEFT SCAPULA - 2+ VIEWS
2 series · 2 of 2 positions shown · non-contrast
Comparison: Left shoulder radiographs-earlier same day

CLINICAL DATA: Post fall, now with left shoulder and scapular pain.

EXAM:
LEFT SCAPULA - 2+ VIEWS

[t scapula ap left]
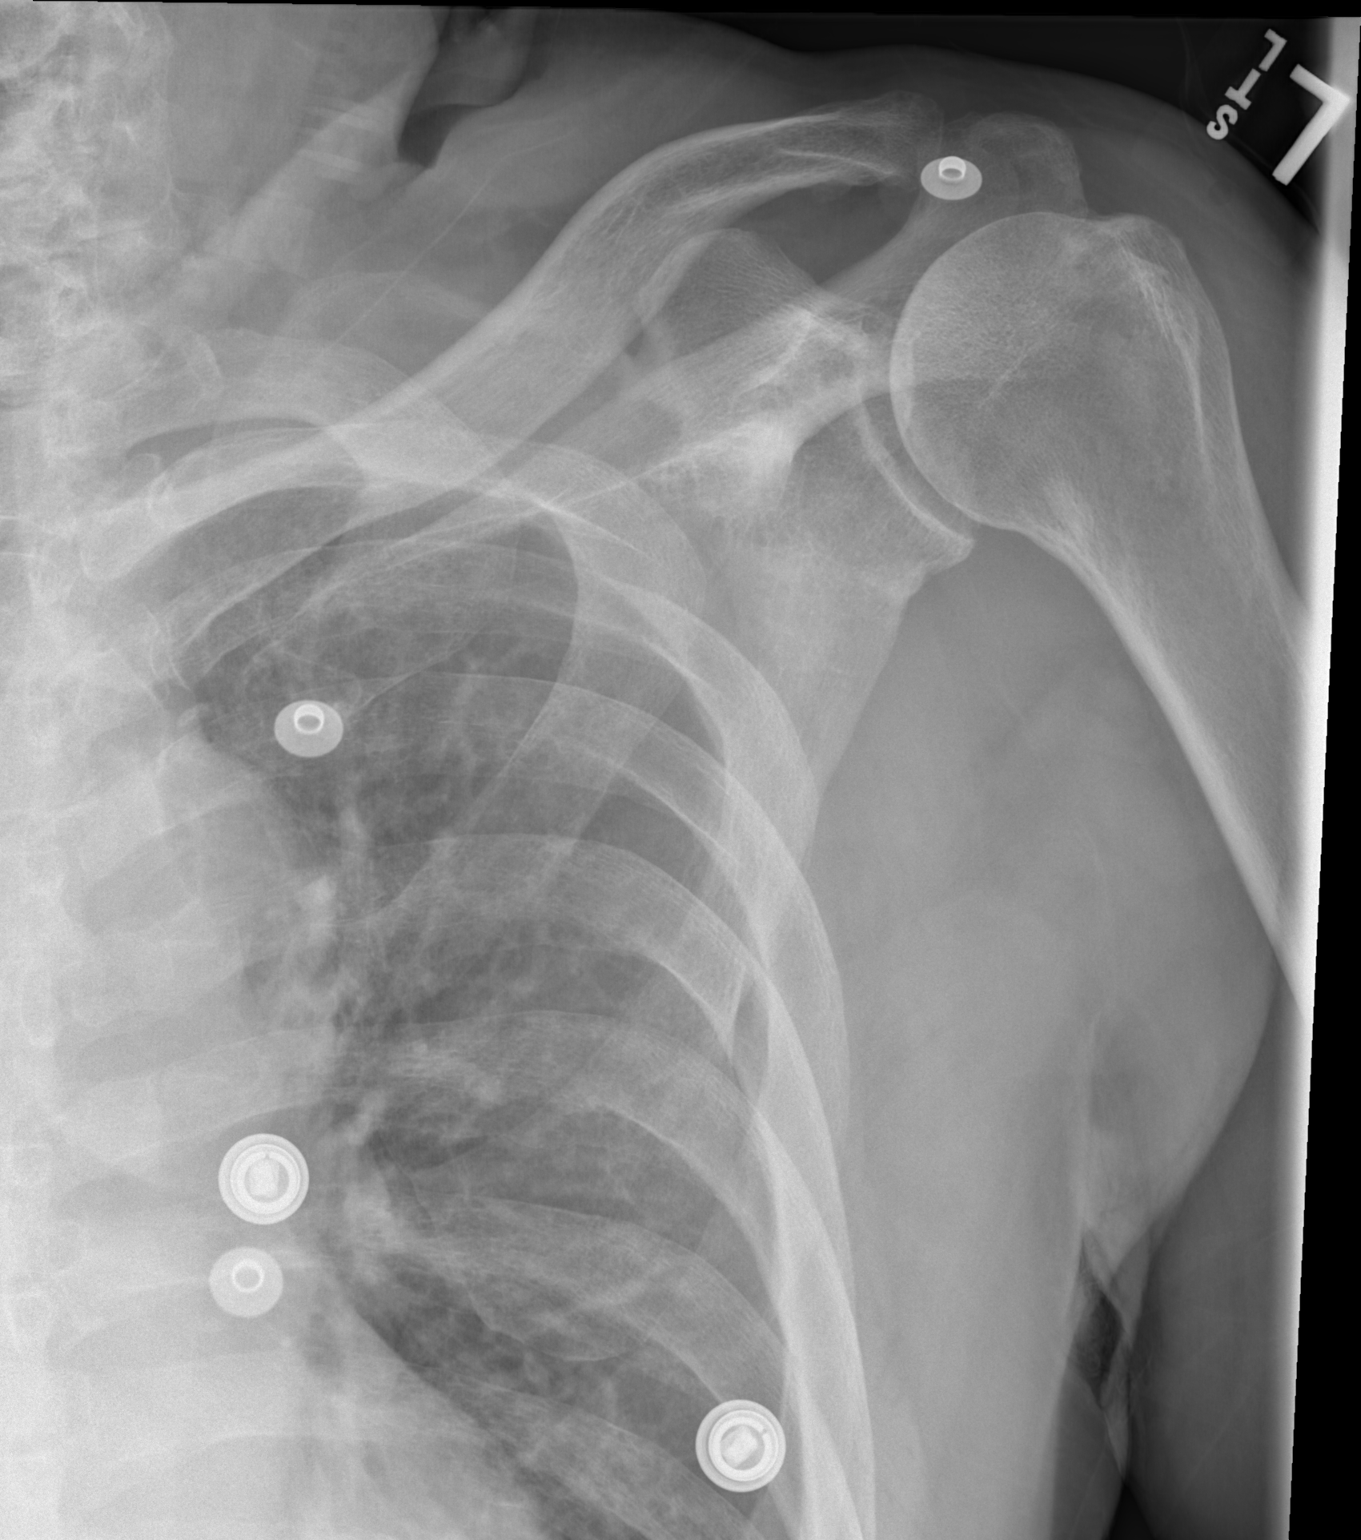

[t scapula y-view left]
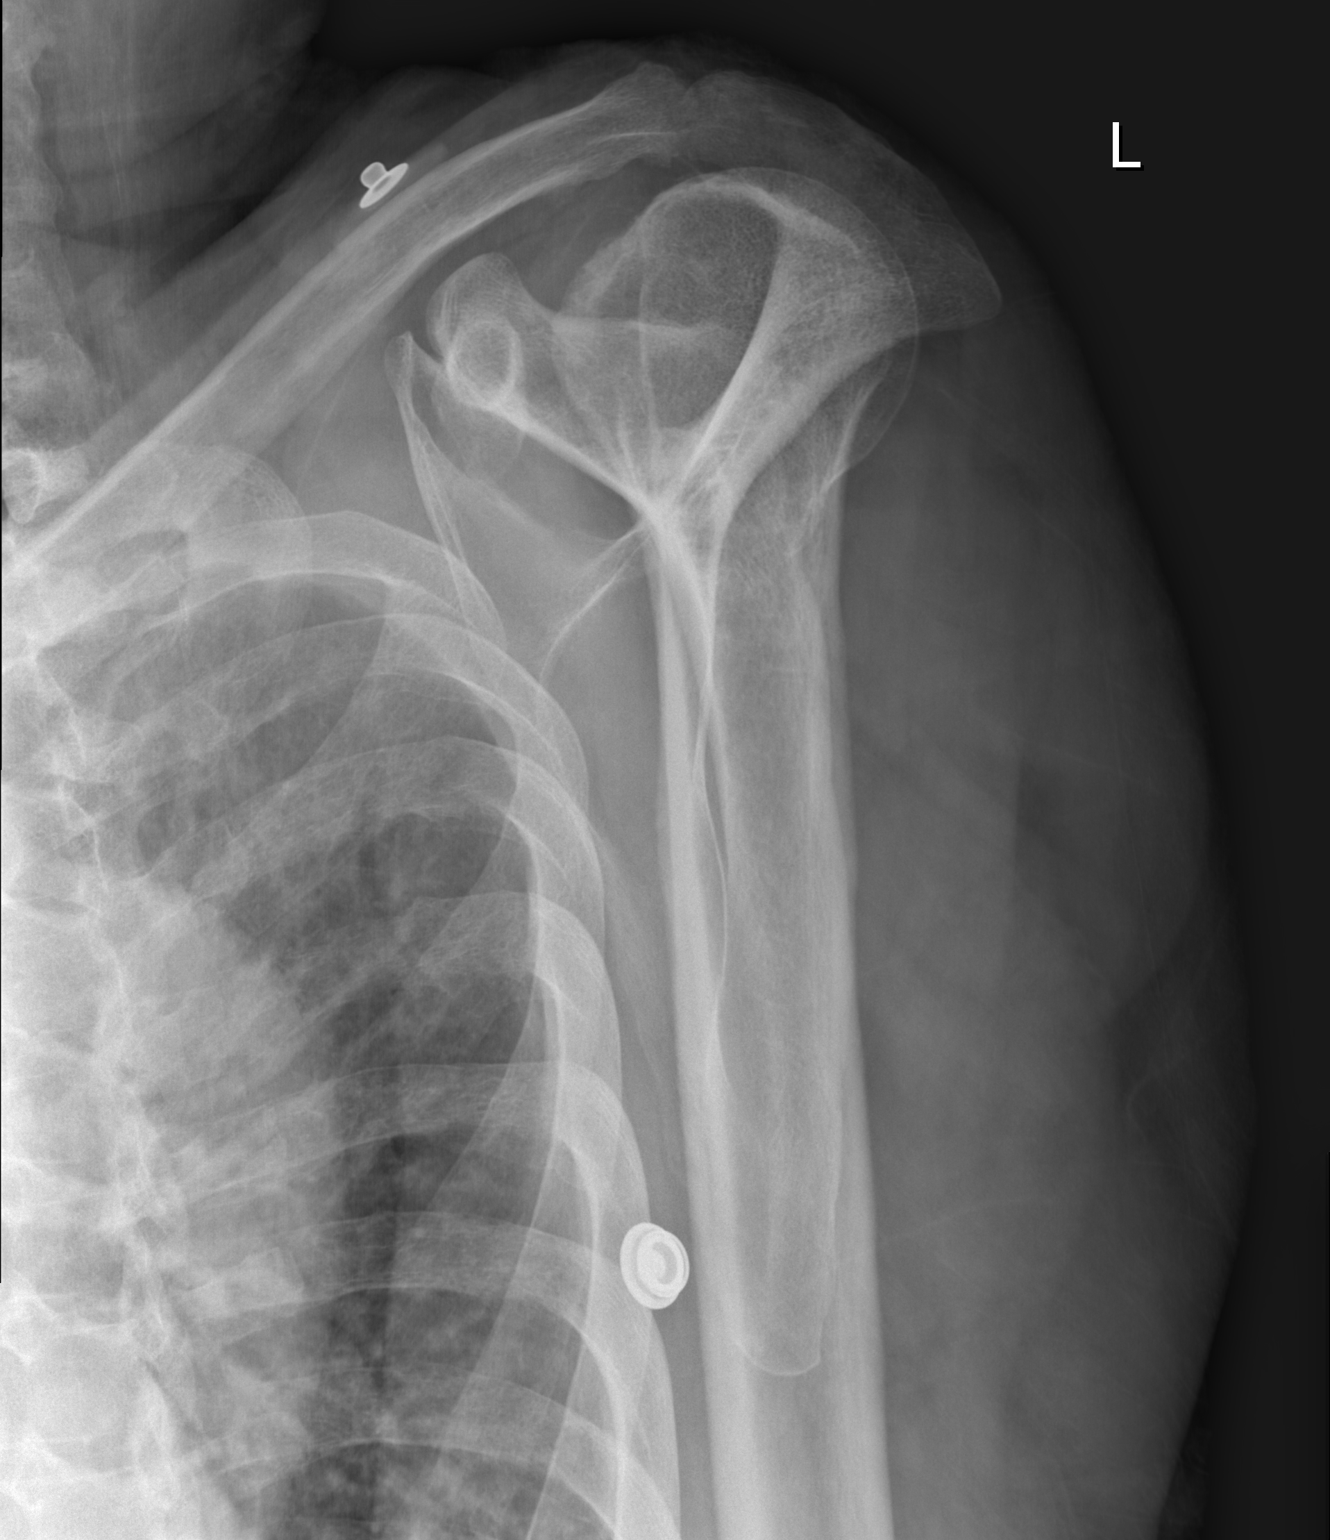

[2 of 2 positions shown; findings below may reference images not displayed]

FINDINGS: No fracture or dislocation. Mild degenerative change the left
glenohumeral joint with joint space loss, subchondral sclerosis
osteophytosis. Normal appearance of the left acromioclavicular
joint. No evidence of calcific tendinitis. Limited visualization
adjacent thorax is normal. Regional soft tissues appear normal. No
radiopaque foreign body.
IMPRESSION: No definite displaced scapular fracture.

## 2021-05-06 IMAGING — CT CT CERVICAL SPINE WITHOUT CONTRAST
3 of 4 series · 11 of 33 positions shown, 13 images · non-contrast
Comparison: 12/21/2009

CLINICAL DATA: Post fall in shower.  History of seizures.

EXAM:
CT HEAD WITHOUT CONTRAST
CT CERVICAL SPINE WITHOUT CONTRAST
TECHNIQUE: Multidetector CT imaging of the head and cervical spine was
performed following the standard protocol without intravenous
contrast. Multiplanar CT image reconstructions of the cervical spine
were also generated.

[Series 5: orthogonal bone · axial · 0.23mm/px · z∈[+1546,+1665]mm · 3 of 112 slices shown, 4 images]
[im 32/112  soft-tissue]
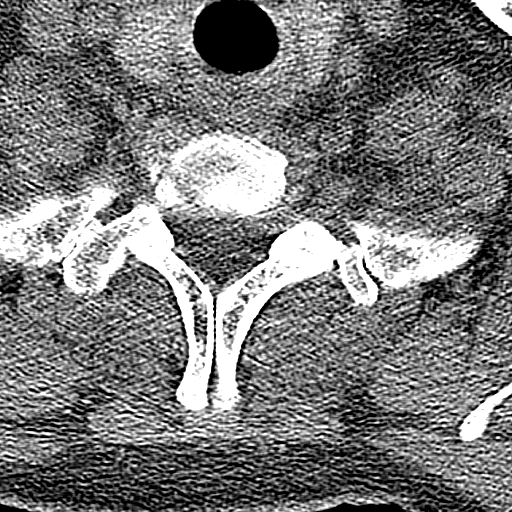
[im 32/112  bone]
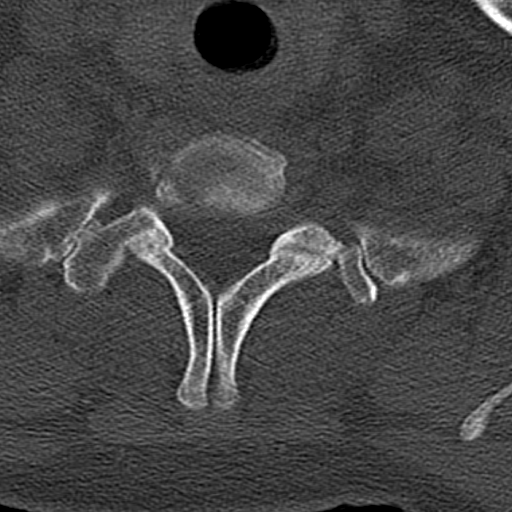
[im 64/112  bone]
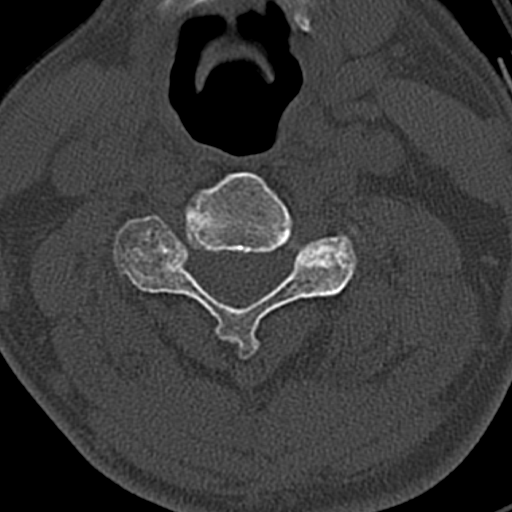
[im 96/112  bone]
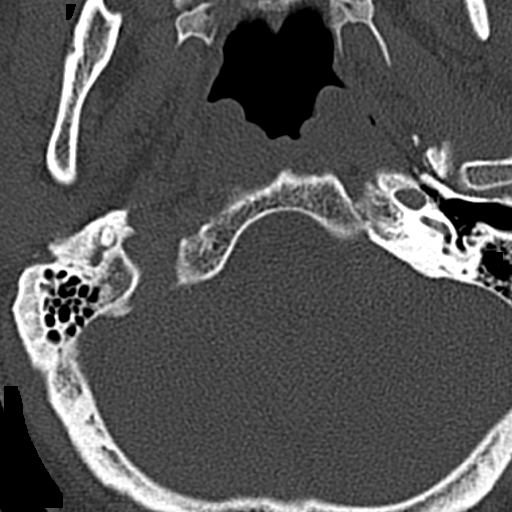

[Series 6: coronal bone · coronal · 0.25mm/px · 3 of 67 slices shown]
[im 14/67  bone]
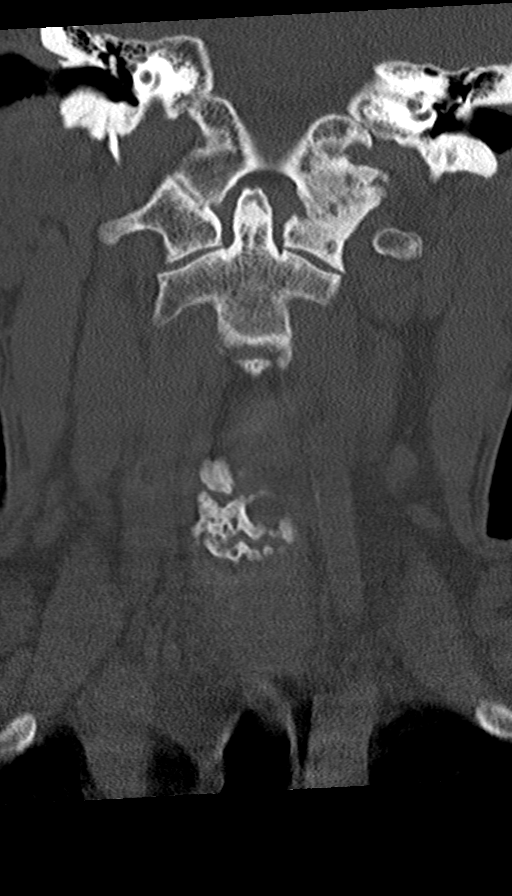
[im 27/67  bone]
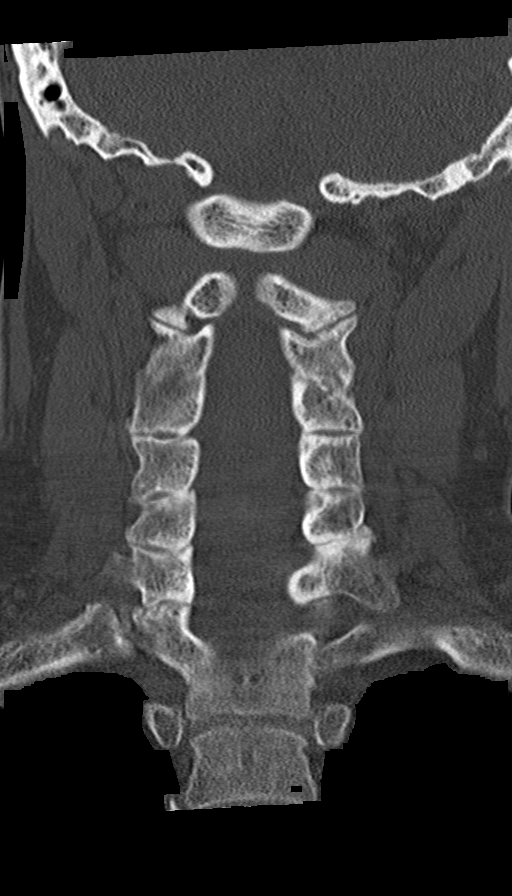
[im 40/67  bone]
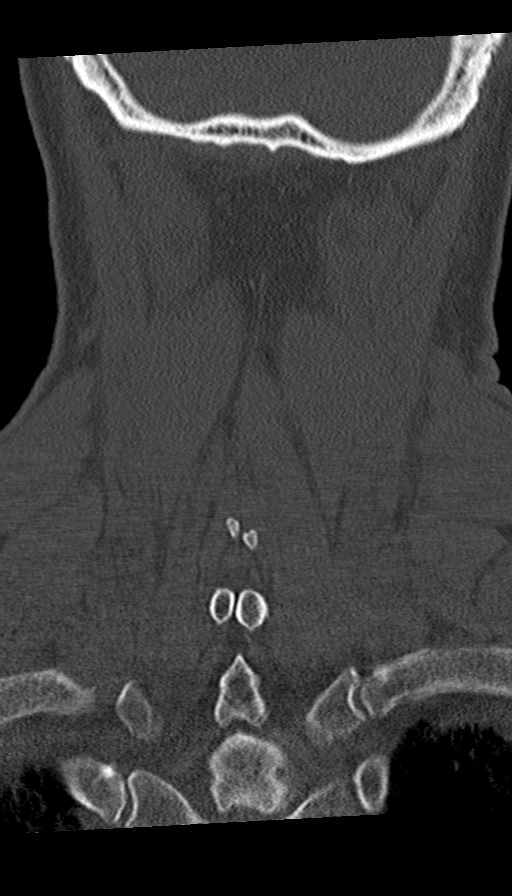

[Series 7: sagittal bone · sagittal · 0.28mm/px · 5 of 61 slices shown, 6 images]
[im 21/61  bone]
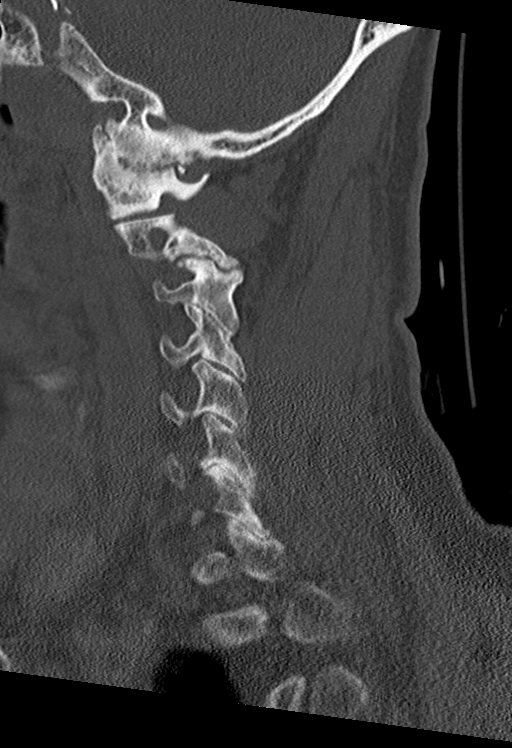
[im 26/61  bone]
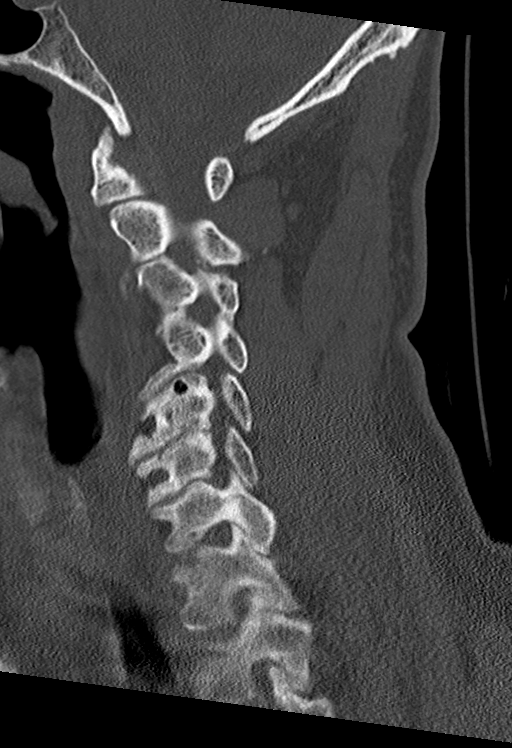
[im 31/61  soft-tissue]
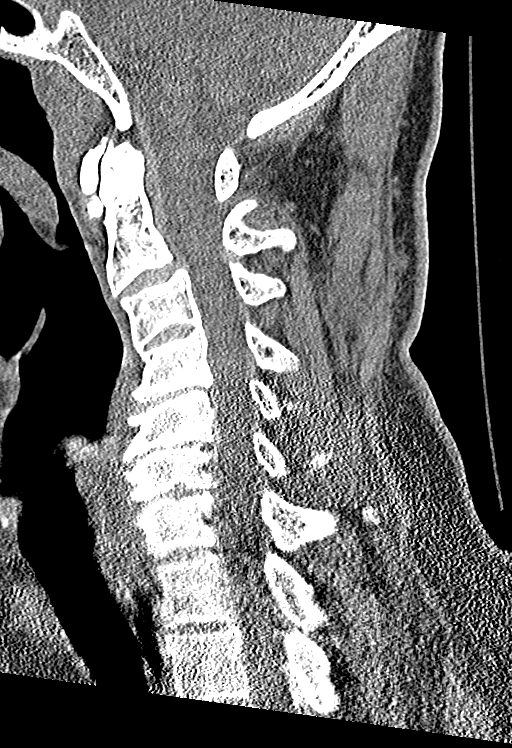
[im 31/61  bone]
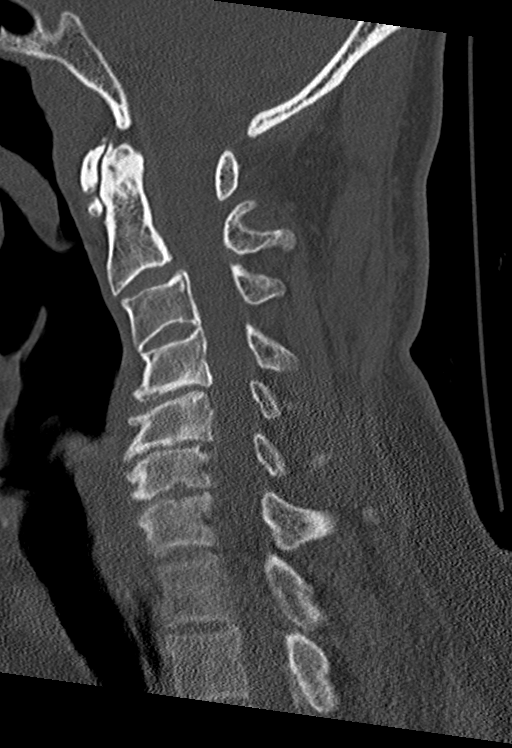
[im 36/61  bone]
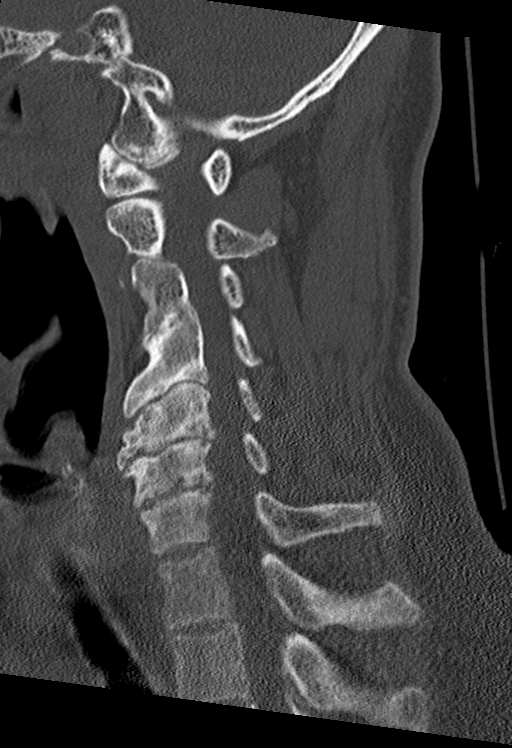
[im 41/61  bone]
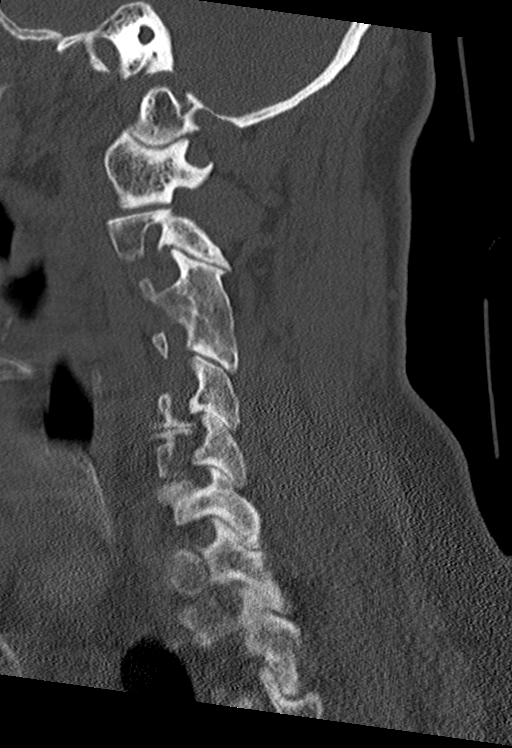

[11 of 33 positions shown; findings below may reference images not displayed]

FINDINGS: CT HEAD FINDINGS

Brain: Mild atrophy with sulcal prominence centralized volume loss
with commensurate ex vacuo dilatation of the ventricular system.
Gray-white differentiation is otherwise well maintained without CT
evidence of acute large territory infarct. No intraparenchymal or
extra-axial mass or hemorrhage. Unchanged size and configuration of
the ventricles and the basilar cisterns. Note is again made of a
septum cavum pellucidum. No midline shift.

Vascular: Intracranial atherosclerosis.

Skull: No displaced calvarial fracture with special attention paid
to the right frontal calvarium.

Sinuses/Orbits: Limited visualization the paranasal sinuses and
mastoid air cells is normal.

Other: There is an apparent laceration about the right-side of the
forehead with associated adjacent soft tissue swelling (images 28
through 33, series 3). No associated radiopaque foreign body

_________________________________________________________

CT CERVICAL SPINE FINDINGS

Alignment: C1 to the superior endplate of T1 is imaged.

There is straightening and slight reversal of the expected cervical
lordosis with mild kyphosis centered about the C4-C5 articulation.

Skull base and vertebrae: The dens is normally positioned between
the lateral masses of C1. Mild degenerative change of the
atlantodental articulation with note made of an approximately 0.6 cm
ossicle inferior to anterior ring of C1, unchanged compared to the
4788 examination. Severe degenerative change of the left
atlantoaxial articulation with suspected partial ankylosis and old
tiny nondisplaced fracture about the lateral subarticular aspect of
the C1 vertebral body (coronal image 20, series 6).

Soft tissues and spinal canal: Prevertebral soft tissues are normal.

Disc levels: Moderate multilevel cervical spine DDD, worse at C4-C5,
C5-C6 and C6-C7 with disc space height loss, endplate irregularity
and sclerosis.

Upper chest: Limited visualization of the lung apices demonstrates
biapical paraseptal emphysematous change with bullous formation
about the medial aspect of the right lung apex, similar to the 4788
examination.

Other: Regional soft tissues appear normal. Normal noncontrast
appearance of the thyroid gland. No bulky cervical lymphadenopathy
on this noncontrast examination.
IMPRESSION: Head CT Impression:

1. Soft tissue laceration about the right-side of the forehead
without associated radiopaque foreign body, displaced calvarial
fracture or acute intracranial process.
2. Similar findings of atrophy and centralized volume loss.

Cervical Spine CT Impression:

1. No fracture or static subluxation of the cervical spine.
2. Moderate multilevel cervical spine DDD, worse at C4-C5, C5-C6 and
C6-C7, progressed compared to the [DATE] examination.
3. Severe degenerative change of the left atlantoaxial articulation
with suspected partial ankylosis.
4.  Emphysema ([id number]).

## 2021-08-14 ENCOUNTER — Ambulatory Visit: Admit: 2021-08-14 | Discharge: 2021-08-15 | Payer: PRIVATE HEALTH INSURANCE

## 2021-08-14 DIAGNOSIS — G8929 Other chronic pain: Principal | ICD-10-CM

## 2021-08-14 DIAGNOSIS — M1711 Unilateral primary osteoarthritis, right knee: Principal | ICD-10-CM

## 2021-08-14 DIAGNOSIS — M25561 Pain in right knee: Principal | ICD-10-CM

## 2021-10-26 ENCOUNTER — Ambulatory Visit: Admit: 2021-10-26 | Discharge: 2021-10-26

## 2021-10-26 DIAGNOSIS — G20 Parkinson's disease: Principal | ICD-10-CM

## 2021-10-26 DIAGNOSIS — G255 Other chorea: Principal | ICD-10-CM

## 2021-10-26 MED ORDER — TETRABENAZINE 12.5 MG TABLET
ORAL_TABLET | Freq: Two times a day (BID) | ORAL | 11 refills | 30 days | Status: CP
Start: 2021-10-26 — End: 2022-10-26

## 2021-11-20 DIAGNOSIS — D509 Iron deficiency anemia, unspecified: Principal | ICD-10-CM

## 2021-12-10 DIAGNOSIS — G255 Other chorea: Principal | ICD-10-CM

## 2021-12-10 MED ORDER — TETRABENAZINE 12.5 MG TABLET
ORAL_TABLET | Freq: Two times a day (BID) | ORAL | 11 refills | 30 days | Status: CP
Start: 2021-12-10 — End: 2022-12-10

## 2021-12-18 ENCOUNTER — Ambulatory Visit
Admit: 2021-12-18 | Discharge: 2021-12-19 | Payer: MEDICAID | Attending: Student in an Organized Health Care Education/Training Program | Primary: Student in an Organized Health Care Education/Training Program

## 2021-12-18 DIAGNOSIS — G20 Parkinson's disease: Principal | ICD-10-CM

## 2021-12-18 DIAGNOSIS — K219 Gastro-esophageal reflux disease without esophagitis: Principal | ICD-10-CM

## 2021-12-18 DIAGNOSIS — I10 Essential (primary) hypertension: Principal | ICD-10-CM

## 2021-12-18 DIAGNOSIS — M1711 Unilateral primary osteoarthritis, right knee: Principal | ICD-10-CM

## 2021-12-18 DIAGNOSIS — D509 Iron deficiency anemia, unspecified: Principal | ICD-10-CM

## 2021-12-18 DIAGNOSIS — B1710 Acute hepatitis C without hepatic coma: Principal | ICD-10-CM

## 2021-12-18 DIAGNOSIS — E7800 Pure hypercholesterolemia, unspecified: Principal | ICD-10-CM

## 2021-12-18 DIAGNOSIS — G255 Other chorea: Principal | ICD-10-CM

## 2021-12-18 MED ORDER — PANTOPRAZOLE 40 MG TABLET,DELAYED RELEASE
ORAL_TABLET | ORAL | 0 refills | 42 days | Status: CP
Start: 2021-12-18 — End: 2022-01-29

## 2021-12-18 MED ORDER — AMLODIPINE 5 MG TABLET
ORAL_TABLET | Freq: Every day | ORAL | 3 refills | 30 days | Status: CP
Start: 2021-12-18 — End: 2022-12-18

## 2021-12-26 ENCOUNTER — Ambulatory Visit: Admit: 2021-12-26 | Discharge: 2021-12-27 | Attending: Neurology | Primary: Neurology

## 2021-12-26 DIAGNOSIS — G255 Other chorea: Principal | ICD-10-CM

## 2021-12-26 MED ORDER — TETRABENAZINE 12.5 MG TABLET
ORAL_TABLET | 11 refills | 0 days | Status: CP
Start: 2021-12-26 — End: ?

## 2022-01-02 DIAGNOSIS — G10 Huntington's disease: Principal | ICD-10-CM

## 2022-01-08 ENCOUNTER — Ambulatory Visit
Admit: 2022-01-08 | Discharge: 2022-01-09 | Attending: Student in an Organized Health Care Education/Training Program | Primary: Student in an Organized Health Care Education/Training Program

## 2022-01-08 MED ORDER — IBUPROFEN 600 MG TABLET
ORAL_TABLET | Freq: Four times a day (QID) | ORAL | 3 refills | 90 days | Status: CP | PRN
Start: 2022-01-08 — End: ?

## 2022-01-08 MED ORDER — TRAZODONE 50 MG TABLET
ORAL_TABLET | Freq: Every evening | ORAL | 3 refills | 30 days | Status: CP
Start: 2022-01-08 — End: ?

## 2022-01-08 MED ORDER — ACETAMINOPHEN 500 MG TABLET
ORAL_TABLET | Freq: Three times a day (TID) | ORAL | 3 refills | 90 days | Status: CP
Start: 2022-01-08 — End: ?

## 2022-01-10 ENCOUNTER — Ambulatory Visit: Admit: 2022-01-10 | Discharge: 2022-01-11

## 2022-01-14 ENCOUNTER — Ambulatory Visit
Admit: 2022-01-14 | Discharge: 2022-01-14 | Attending: Student in an Organized Health Care Education/Training Program | Primary: Student in an Organized Health Care Education/Training Program

## 2022-01-14 DIAGNOSIS — B182 Chronic viral hepatitis C: Principal | ICD-10-CM

## 2022-01-14 DIAGNOSIS — D509 Iron deficiency anemia, unspecified: Principal | ICD-10-CM

## 2022-01-16 DIAGNOSIS — B182 Chronic viral hepatitis C: Principal | ICD-10-CM

## 2022-01-16 MED ORDER — SOFOSBUVIR 400 MG-VELPATASVIR 100 MG TABLET
ORAL_TABLET | Freq: Every day | ORAL | 2 refills | 28 days | Status: CP
Start: 2022-01-16 — End: ?

## 2022-01-17 DIAGNOSIS — B182 Chronic viral hepatitis C: Principal | ICD-10-CM

## 2022-01-18 ENCOUNTER — Ambulatory Visit: Admit: 2022-01-18 | Discharge: 2022-01-19

## 2022-01-18 DIAGNOSIS — M1711 Unilateral primary osteoarthritis, right knee: Principal | ICD-10-CM

## 2022-01-24 ENCOUNTER — Ambulatory Visit: Admit: 2022-01-24 | Discharge: 2022-01-25

## 2022-02-08 ENCOUNTER — Ambulatory Visit: Admit: 2022-02-08 | Discharge: 2022-02-09

## 2022-02-08 DIAGNOSIS — G10 Huntington's disease: Principal | ICD-10-CM

## 2022-02-08 MED ORDER — TETRABENAZINE 12.5 MG TABLET
ORAL_TABLET | Freq: Two times a day (BID) | ORAL | 11 refills | 30 days | Status: CP
Start: 2022-02-08 — End: 2023-02-08

## 2022-02-11 ENCOUNTER — Ambulatory Visit
Admit: 2022-02-11 | Discharge: 2022-02-12 | Payer: PRIVATE HEALTH INSURANCE | Attending: Student in an Organized Health Care Education/Training Program | Primary: Student in an Organized Health Care Education/Training Program

## 2022-02-11 DIAGNOSIS — B182 Chronic viral hepatitis C: Principal | ICD-10-CM

## 2022-02-11 MED ORDER — SOFOSBUVIR 400 MG-VELPATASVIR 100 MG TABLET
ORAL_TABLET | Freq: Every day | ORAL | 2 refills | 28 days | Status: CP
Start: 2022-02-11 — End: ?

## 2022-02-11 MED ORDER — TETRABENAZINE 12.5 MG TABLET
ORAL_TABLET | Freq: Two times a day (BID) | ORAL | 11 refills | 30 days | Status: CP
Start: 2022-02-11 — End: 2023-02-11
  Filled 2022-02-14: qty 60, 30d supply, fill #0

## 2022-02-11 MED ORDER — MIRTAZAPINE 15 MG TABLET
ORAL_TABLET | Freq: Every evening | ORAL | 2 refills | 30 days | Status: CP
Start: 2022-02-11 — End: 2023-02-11

## 2022-02-11 MED ORDER — MAGNESIUM OXIDE 400 MG (241.3 MG MAGNESIUM) TABLET
ORAL_TABLET | Freq: Every day | ORAL | 2 refills | 30 days | Status: CP
Start: 2022-02-11 — End: ?
  Filled 2022-02-14: qty 60, 30d supply, fill #0

## 2022-02-11 MED ORDER — BACLOFEN 10 MG TABLET
ORAL_TABLET | Freq: Three times a day (TID) | ORAL | 1 refills | 30 days | Status: CP
Start: 2022-02-11 — End: 2023-02-11

## 2022-02-11 MED ORDER — FAMOTIDINE 20 MG TABLET
ORAL_TABLET | Freq: Two times a day (BID) | ORAL | 2 refills | 30 days | Status: CP
Start: 2022-02-11 — End: 2023-02-11
  Filled 2022-02-14: qty 60, 30d supply, fill #0

## 2022-02-11 NOTE — Unmapped (Signed)
We have added baclofen to help with pain in your back and remeron to hopefully help with mood and sleep.  I have also resent the Neurology medication and the liver medication to [facility] pharmacy. Give [place] a few days to hear back from whether they will be able to send those to medications to you or which pharmacy we can have you pick it up from.

## 2022-02-11 NOTE — Unmapped (Signed)
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  I am speaking to  Mark Chaney, Production designer, theatre/television/film of patient's transitional house .    Was a Nurse, learning disability used for this call? No    Verified patient's date of birth / HIPAA.    Specialty medication(s) to be sent: Neurology: tetrabenazine      Non-specialty medications/supplies to be sent: Famotidine, Magnesium      Medications not needed at this time: n/a         Xenazine (tetrabenazine)    The patient declined counseling on medication administration, missed dose instructions, goals of therapy, side effects and monitoring parameters, warnings and precautions, drug/food interactions, and storage, handling precautions, and disposal because they were counseled in clinic. The information in the declined sections below are for informational purposes only and was not discussed with patient.       Medication & Administration     Dosage:  Take 1 tablet (12.5mg ) by mouth twice daily    Administration: Take by mouth without regard to meals    Adherence/Missed dose instructions: Take a missed dose as soon as you remember. If it is close to the time of your next dose, skip the missed dose and resume your normal schedule. Do not take 2 doses at once.  If you miss more than 5 days of medication call your Neurologist or the [facility]    Goals of Therapy     Decrease the frequency and severity of tardive movements    Side Effects & Monitoring Parameters     Drowsiness  Fatigue  Upset stomach/vomiting  Difficulty sleeping  Anxiety  Signs of a common cold    The following side effects should be reported to the provider:  Signs of an allergic reaction  Change in balance, falls  Shakiness, stiffness, trouble moving around  Restlessness  Agitation, confusion  Difficult swallowing  Vision changes  Abnormal or fast heartbeat  Passing out  Signs of NMS (fever, muscle cramps or stiffness, dizziness, severe headache, confusion, change in thinking, excessive sweating, abnormal heartbeat)      Contraindications, Warnings & Precautions     BLACK BOX: Increased risk for depression and suicidal thoughts and behaviors in patients with Huntington disease  Monitor for emergence or worsening of signs/symptoms of depression  Not appropriate for the treatment of levodopa-induced dyskinesia  QT prolongation (avoid in patients with history of arrhythmias or concomitant drugs known to cause QT prolongation)  Ophthalmic effects (any vision changes should be reported)  Patients should be tested for CYP2D6 prior to initiating dose more than 50mg /day    Drug/Food Interactions     Medication list reviewed in Epic. The patient was instructed to inform the care team before taking any new medications or supplements.  Potential DDI Baclofen/ Mirtazapine/ Trazodone/ Tetrabenazine. Summary: CNS Depressants may enhance the adverse/toxic effect of other CNS Depressants. Severity Moderate. Patient Management :The concomitant use of two or more drugs that have the potential to depress CNS function (either as a therapeutic intention or a side effect) is often clinically appropriate. However, it is important to recognize that the risk of unwanted effects may increase with such use. Consider the duration of CNS depressant use and each patient's response (particularly tolerance to CNS depressant effects) when selecting additional agents and their doses. Dose reductions of one or both CNS depressant agents may be  necessary. Monitor for additive CNS-depressant effects whenever two or more CNS depressants are concomitantly used. Advise patients to avoid any unprescribed, illicit, or recreational use of other CNS depressants. .   Possible DDI Tetrabenazine/ Duloxetine. Summary: CYP2D6 Inhibitors (Duloxetine) may increase serum concentrations of the active metabolite(s) of Tetrabenazine. Specifically, concentrations of the active alpha- and beta-dihydrotetrabenazine metabolites may be increased. Severity Moderate. Patient Management: Monitor for increased tetrabenazine effects and toxicities if combined with moderate CYP2D6 inhibitors.      Storage, Handling Precautions, & Disposal     Mark Chaney should be stored at room temperature.      Current Medications (including OTC/herbals), Comorbidities and Allergies     Current Outpatient Medications   Medication Sig Dispense Refill    acetaminophen (TYLENOL EXTRA STRENGTH) 500 MG tablet Take 2 tablets (1,000 mg total) by mouth Three (3) times a day. 540 tablet 3    amLODIPine (NORVASC) 5 MG tablet Take 1 tablet (5 mg total) by mouth daily. 30 tablet 3    baclofen (LIORESAL) 10 MG tablet Take 1 tablet (10 mg total) by mouth Three (3) times a day. 90 tablet 1    DULoxetine (CYMBALTA) 60 MG capsule Take 1 capsule (60 mg total) by mouth daily. 60 capsule 2    famotidine (PEPCID) 20 MG tablet Take 1 tablet (20 mg total) by mouth Two (2) times a day. 60 tablet 2    ferrous sulfate 325 (65 FE) MG EC tablet Take 1 tablet (325 mg total) by mouth in the morning. 30 tablet 3    ibuprofen (MOTRIN) 600 MG tablet Take 1 tablet (600 mg total) by mouth every six (6) hours as needed for pain. 360 tablet 3    lisinopriL (PRINIVIL,ZESTRIL) 20 MG tablet Take 1 tablet (20 mg total) by mouth daily. 60 tablet 2    magnesium oxide (MAG-OX) 400 mg (241.3 mg elemental magnesium) tablet Take 2 tablets (800 mg total) by mouth daily. 60 tablet 2    metoprolol tartrate (LOPRESSOR) 25 MG tablet Take 1 tablet (25 mg total) by mouth in the morning. 60 tablet 2    mirtazapine (REMERON) 15 MG tablet Take 1 tablet (15 mg total) by mouth nightly. 30 tablet 2    simvastatin (ZOCOR) 20 MG tablet Take 1 tablet (20 mg total) by mouth nightly. 60 tablet 2    sofosbuvir-velpatasvir (EPCLUSA) 400-100 mg tablet Take 1 tablet by mouth daily. 28 tablet 2    tetrabenazine (XENAZINE) 12.5 MG tablet Take 1 tablet (12.5 mg total) by mouth Two (2) times a day. 60 tablet 11    traZODone (DESYREL) 50 MG tablet Take 1 tablet (50 mg total) by mouth nightly. 30 tablet 3     No current facility-administered medications for this visit.       No Known Allergies    Patient Active Problem List   Diagnosis    Primary localized osteoarthrosis of right lower leg    Primary hypertension    Chronic bilateral low back pain without sciatica    Hiatal hernia    Hypercholesteremia    Parkinson disease (CMS-HCC)    Microcytic anemia    Gastroesophageal reflux disease without esophagitis       Reviewed and up to date in Epic.    Appropriateness of Therapy     Acute infections noted within Epic:  No active infections  Patient reported infection: None    Is medication and dose appropriate based on diagnosis and infection status? Yes    Prescription has  been clinically reviewed: Yes      Baseline Quality of Life Assessment      How many days over the past month did your Huntington chorea  keep you from your normal activities? For example, brushing your teeth or getting up in the morning. Patient declined to answer    Financial Information     Medication Assistance provided: None Required    Anticipated copay of $4 reviewed with patient. Verified delivery address.    Delivery Information     Scheduled delivery date: 02/15/22    Expected start date: 02/15/22    Medication will be delivered via Next Day Courier to the prescription address in [facility].  This shipment will not require a signature.      Explained the services we provide at [facility] and that each month we would call to set up refills.  Stressed importance of returning phone calls so that we could ensure they receive their medications in time each month.  Informed patient that we should be setting up refills 7-10 days prior to when they will run out of medication.  A pharmacist will reach out to perform a clinical assessment periodically. Informed patient that a welcome packet, containing information about our pharmacy and other support services, a Notice of Privacy Practices, and a drug information handout will be sent.      The patient or caregiver noted above participated in the development of this care plan and knows that they can request review of or adjustments to the care plan at any time.      Patient or caregiver verbalized understanding of the above information as well as how to contact the pharmacy at [phone] option 4 with any questions/concerns.  The pharmacy is open Monday through Friday 8:30am-4:30pm.  A pharmacist is available 24/7 via pager to answer any clinical questions they may have.    Patient Specific Needs     Does the patient have any physical, cognitive, or cultural barriers? No    Does the patient have adequate living arrangements? (i.e. the ability to store and take their medication appropriately) Yes    Did you identify any home environmental safety or security hazards? No    Patient prefers to have medications discussed with  Patient     Is the patient or caregiver able to read and understand education materials at a high school level or above? Yes    Patient's primary language is  English     Is the patient high risk? No    SOCIAL DETERMINANTS OF HEALTH     At the [facility], we have learned that life circumstances - like trouble affording food, housing, utilities, or transportation can affect the health of many of our patients.   That is why we wanted to ask: are you currently experiencing any life circumstances that are negatively impacting your health and/or quality of life? Patient declined to answer    Social Determinants of Health     Financial Resource Strain: High Risk (02/04/2022)    Overall Financial Resource Strain (CARDIA)     Difficulty of Paying Living Expenses: Hard   Internet Connectivity: Not on file   Food Insecurity: Food Insecurity Present (02/04/2022)    Hunger Vital Sign     Worried About Running Out of Food in the Last Year: Sometimes true     Ran Out of Food in the Last Year: Never true   Tobacco Use: Medium Risk (02/11/2022)  Patient History     Smoking Tobacco Use: Former     Smokeless Tobacco Use: Never     Passive Exposure: Not on file   Housing/Utilities: Low Risk  (02/04/2022)    Housing/Utilities     Within the past 12 months, have you ever stayed: outside, in a car, in a tent, in an overnight shelter, or temporarily in someone else's home (i.e. couch-surfing)?: No     Are you worried about losing your housing?: No     Within the past 12 months, have you been unable to get utilities (heat, electricity) when it was really needed?: No   Alcohol Use: Not on file   Transportation Needs: Unmet Transportation Needs (02/04/2022)    PRAPARE - Therapist, art (Medical): Yes     Lack of Transportation (Non-Medical): Yes   Substance Use: Not on file   Health Literacy: Medium Risk (08/14/2021)    Health Literacy     : Rarely   Physical Activity: Not on file   Interpersonal Safety: Not on file   Stress: Not on file   Intimate Partner Violence: Not on file   Depression: Not at risk (12/26/2021)    PHQ-2     PHQ-2 Score: 2   Social Connections: Not on file       Would you be willing to receive help with any of the needs that you have identified today? Not applicable       Camillo Flaming  [facility] Specialty Pharmacist

## 2022-02-11 NOTE — Unmapped (Signed)
[facility] Specialty Medication Onboarding    Specialty Medication: Tetrabenazine  Prior Authorization: Not Required   Financial Assistance: No - copay  <$25  Final Copay/Day Supply: $4 / 30    Insurance Restrictions: None     Notes to Pharmacist: PA pending for Sofosbuvir-velpatasvir    The triage team has completed the benefits investigation and has determined that the patient is able to fill this medication at [facility]. Please contact the patient to complete the onboarding or follow up with the prescribing physician as needed.

## 2022-02-11 NOTE — Unmapped (Signed)
[facility]    Assessment/Plan:       Mark Chaney is a 66 y.o. male who presented with the following:    Diagnoses and all orders for this visit:    Chronic bilateral low back pain without sciatica  Patients low back pain persistent with duloxetine and ibuprofen treatment. Add Baclofen to help with pain related to chorea.   -     baclofen (LIORESAL) 10 MG tablet; Take 1 tablet (10 mg total) by mouth Three (3) times a day.    Chronic hepatitis C without hepatic coma (CMS-HCC)  Managed by [facility]. Patient has been unable to obtain epclusa due to pharmacy issue. Resent prescription to [facility] pharmacy.  -     sofosbuvir-velpatasvir (EPCLUSA) 400-100 mg tablet; Take 1 tablet by mouth daily.    Huntington chorea (CMS-HCC)  Managed by [facility] neurology. Patient has been unable to take increased dosage of tetrabenazine due to pharmacy issue. Resent prescription to [facility] pharmacy. Has upcoming appointment with Psychiatry to address huntington related mood symptoms. Add mirtazapine to help with mood related to Huntingtons as well as issues with sleep.        -     tetrabenazine (XENAZINE) 12.5 MG tablet; Take 1 tablet (12.5 mg total) by mouth Two (2) times a day.       -     mirtazapine (REMERON) 15 MG tablet; Take 1 tablet (15 mg total) by mouth nightly.    Other orders  -     INFLUENZA VACCINE (QUAD) IM - 6 MO-ADULT - PF  -     baclofen (LIORESAL) 10 MG tablet; Take 1 tablet (10 mg total) by mouth Three (3) times a day.  -     mirtazapine (REMERON) 15 MG tablet; Take 1 tablet (15 mg total) by mouth nightly.  -     magnesium oxide (MAG-OX) 400 mg (241.3 mg elemental magnesium) tablet; Take 2 tablets (800 mg total) by mouth daily.  -     famotidine (PEPCID) 20 MG tablet; Take 1 tablet (20 mg total) by mouth Two (2) times a day.      Follow up: Return in about 1 month (around 03/13/2022) for mood and back pain.          Subjective:        Patient ID: Mark Chaney is a 66 y.o. male who presents for:  Chief Complaint   Patient presents with    Hypertension       Patient is here to follow-up on his hypertension and other chronic conditions.     Currently, his main concern is that his pain in his leg, knee, and low bacl is worsening. It is causing to have trouble falling asleep and will wake up in the night from pain. He will take ibuprofen for the pain which helps for a couple of hours.     Since his last visit, received Huntington's disease diagnosis. Has been unable to receive new treatment from [facility] pharmacy.     Patient was also seen for Hep C treatment and has been unable to get this medication from his pharmacy as well.     He reports that his mood has been down and he has been feeling depressed and will also have some outbursts. He has an upcoming appointment with psychiatry.       ROS: Pertinent review of systems is described above in HPI.      PMHx/PSHx/FMHx/SHx reviewed and updated in Epic.  Objective       PHYSICAL EXAM:   BP 122/76  - Pulse 76  - Temp 35.7 ??C (96.3 ??F) (Temporal)  - Wt 81.2 kg (179 lb)  - BMI 23.62 kg/m??   Wt Readings from Last 3 Encounters:   02/11/22 81.2 kg (179 lb)   01/18/22 81.2 kg (179 lb)   01/10/22 81.6 kg (180 lb)     Physical Exam  HENT:      Head: Normocephalic and atraumatic.   Eyes:      Extraocular Movements: Extraocular movements intact.      Pupils: Pupils are equal, round, and reactive to light.   Cardiovascular:      Rate and Rhythm: Normal rate and regular rhythm.      Heart sounds: Normal heart sounds.   Pulmonary:      Effort: Pulmonary effort is normal.      Breath sounds: Normal breath sounds.   Musculoskeletal:      Lumbar back: Tenderness present.   Neurological:      Mental Status: He is alert.      Coordination: Coordination abnormal.   Psychiatric:         Mood and Affect: Mood normal.             I attest that I, Murphy Duzan Lopez-Lengowski evaluated this patient and created this note for Dr. Karie Schwalbe.    Ruthy Dick, MS3  [facility]  02/11/2022

## 2022-02-14 NOTE — Unmapped (Signed)
[facility] Specialty Medication Onboarding    Specialty Medication: sofosbuvir-velpatasvir 400-100 mg tablet (EPCLUSA)  Prior Authorization: Approved   Financial Assistance: No - copay  <$25  Final Copay/Day Supply: $4 / 28    Insurance Restrictions: None     Notes to Pharmacist:     The triage team has completed the benefits investigation and has determined that the patient is able to fill this medication at [facility]. Please contact the patient to complete the onboarding or follow up with the prescribing physician as needed.

## 2022-02-15 NOTE — Unmapped (Addendum)
[facility]   Patient Onboarding/Medication Counseling    Mark Chaney is a 66 y.o. male with hepatitis C who I am counseling today on initiation of therapy.  I am speaking to  [name]: Dietitian at the transitional home .    Was a Nurse, learning disability used for this call? No    Verified patient's date of birth / HIPAA.    Specialty medication(s) to be sent: Infectious Disease: sofosbuvir/velpatasvir      Non-specialty medications/supplies to be sent: none      Medications not needed at this time: n/a         Epclusa (sofosbuvir and velpatasvir) 400mg /100mg     Planned Start Date: 02/20/22    Has the patient been told to call and make a 4 week follow-up appointment after their medicine has been received? Yes, Mr. Laural Benes was told to contact Park Breed, CPP at [phone]      Medication & Administration     Dosage: Take one tablet by mouth daily for 12 weeks.    Administration:  Take with or without food.  Antacids: Separate the administration of Epclusa and antacids by at least 4 hours.  H2 Receptor Antagonist: Administer H2 receptor antagonist doses less than or comparable to famotidine 40 mg twice daily simultaneously or 12 hours apart from [place].   Proton Pump Inhibitor (PPI): Epclusa should be administered with food and taken 4 hours before omeprazole max dose of 20mg .      Adherence/Missed dose instructions:  Take missed dose as soon as you remember. If it is close to the time of your next dose, skip the missed dose and resume with your next scheduled dose.  Do not take extra doses or 2 doses at the same time.  Avoid missing doses as it can result in development of resistance.    Goals of Therapy     The goal of therapy is to cure the patient of Hepatitis C. Patients who have undetectable HCV RNA in the serum, as assessed by a sensitive polymerase chain reaction (PCR) assay, ?12 weeks after treatment completion are deemed to have achieved SVR (cure). (www.hcvguidelines.org)    Side Effects & Monitoring Parameters     Common Side Effects:   Headache  Fatigue     To help minimize some of the side effects: (http://www.velazquez.com/)  Stay hydrated by drinking plenty of water and limiting caffeinated beverages such as coffee, tea and soda.  Do your hardest chores when you have the most energy.  Take short naps of 20 minutes or less that are and not too close to bedtime.    The following side effects should be reported to the provider:  Signs of liver problems like dark urine, feeling excessively tired, lack of hunger, upset stomach or stomach pain, light-colored stools, throwing up, or yellow skin/eyes.  Signs of an allergic reaction, such as rash; hives; itching; red, swollen, blistered, or peeling skin with or without fever. If you have wheezing; tightness in the chest or throat; trouble breathing, swallowing, or talking; unusual hoarseness; or swelling of the mouth, face, lips, tongue, or throat, call 911 or go to the closest emergency department (ED).     Monitoring Parameters: (AASLD-IDSA Guidelines)    Within 6 months prior to starting treatment:  Complete blood count (CBC).  International normalized ratio (INR) if clinically indicated.   Hepatic function panel: albumin, total and direct bilirubin, ALT, AST, and Alkaline Phosphatase levels.  Calculated glomerular filtration rate (eGFR).  Pregnancy test  within 1 month of starting treatment for females of childbearing age  Any time prior to starting treatment:  Test for HCV genotype.  Assess for hepatic fibrosis.  Quantitative HCV RNA (HCV viral load).  Assess for active HBV coinfection: HBV surface antigen (HBsAg); If HBsAg positive, then should be assessed for whether HBV DNA level meets AASLD criteria for HBV treatment.  Assess for evidence of prior HBV infection and immunity: HBV core antibody (anti-HBc) and HBV surface antibody (anti-HBS).   Assess for HIV coinfection.  Test for the presence of resistance-associated substitutions (RASs) prior to starting treatment if clinically indicated.    Monitoring during treatment:   Diabetics should monitor for signs of hypoglycemia.  Patients on warfarin should monitor for changes in INR.  Pregnancy test monthly in females of childbearing age  For HBsAg positive patients not already on HBV therapy because baseline HBV DNA level does not meet treatment criteria:   Initiate prophylactic HBV antiviral therapy OR  Monitor HBV DNA levels monthly during and immediately after Epclusa therapy.    After treatment to document a cure:   Quantitative HCV RNA12 weeks after completion of therapy.    Contraindications, Warnings, & Precautions     Hepatitis B reactivation.  Bradycardia with amiodarone coadministration: Coadministration not recommended. If unavoidable, patients should have inpatient cardiac monitoring for 48 hours after first Epclusa dose, followed by daily outpatient or self-monitoring of heart rate for at least the first 2 weeks of treatment. Due to the long half-life of amiodarone, cardiac monitoring is also recommended if amiodarone was discontinued just prior to beginning treatment.     Drug/Food Interactions     Medication list reviewed in Epic. The patient was instructed to inform the care team before taking any new medications or supplements.   Drug interactions:   Famotidine: reduces absorption of Epclusa.  Told to take first dose at the same time as Epclusa and second dose 12 hours later  Simvastatin: Epclusa can increase levels of simvastatin.  Told to monitor for toxicity such as muscle pain or muscle pain combined with darkened urine.  Magnesium oxide: lowers absorption of Epclusa. Told to space at least 4 hours from Epclusa dose.  Encourage minimizing alcohol intake: Denies alcohol    Storage, Handling Precautions, & Disposal     Store this medication in the original container at room temperature.  Store in a dry place. Do not store in a bathroom.  Keep all drugs in a safe place. Keep all drugs out of the reach of children and pets.  Disposal: You should NOT have any Epclusa left over to throw away      Current Medications (including OTC/herbals), Comorbidities and Allergies     Current Outpatient Medications   Medication Sig Dispense Refill    acetaminophen (TYLENOL EXTRA STRENGTH) 500 MG tablet Take 2 tablets (1,000 mg total) by mouth Three (3) times a day. 540 tablet 3    amLODIPine (NORVASC) 5 MG tablet Take 1 tablet (5 mg total) by mouth daily. 30 tablet 3    baclofen (LIORESAL) 10 MG tablet Take 1 tablet (10 mg total) by mouth Three (3) times a day. 90 tablet 1    DULoxetine (CYMBALTA) 60 MG capsule Take 1 capsule (60 mg total) by mouth daily. 60 capsule 2    famotidine (PEPCID) 20 MG tablet Take 1 tablet (20 mg total) by mouth Two (2) times a day. 60 tablet 2    ferrous sulfate 325 (65 FE) MG EC tablet Take 1  tablet (325 mg total) by mouth in the morning. 30 tablet 3    ibuprofen (MOTRIN) 600 MG tablet Take 1 tablet (600 mg total) by mouth every six (6) hours as needed for pain. 360 tablet 3    lisinopriL (PRINIVIL,ZESTRIL) 20 MG tablet Take 1 tablet (20 mg total) by mouth daily. 60 tablet 2    magnesium oxide (MAG-OX) 400 mg (241.3 mg elemental magnesium) tablet Take 2 tablets (800 mg total) by mouth daily. 60 tablet 2    metoprolol tartrate (LOPRESSOR) 25 MG tablet Take 1 tablet (25 mg total) by mouth in the morning. 60 tablet 2    mirtazapine (REMERON) 15 MG tablet Take 1 tablet (15 mg total) by mouth nightly. 30 tablet 2    simvastatin (ZOCOR) 20 MG tablet Take 1 tablet (20 mg total) by mouth nightly. 60 tablet 2    sofosbuvir-velpatasvir (EPCLUSA) 400-100 mg tablet Take 1 tablet by mouth daily. 28 tablet 2    tetrabenazine (XENAZINE) 12.5 MG tablet Take 1 tablet (12.5 mg total) by mouth Two (2) times a day. 60 tablet 11    traZODone (DESYREL) 50 MG tablet Take 1 tablet (50 mg total) by mouth nightly. 30 tablet 3     No current facility-administered medications for this visit.       No Known Allergies    Patient Active Problem List   Diagnosis    Primary localized osteoarthrosis of right lower leg    Primary hypertension    Chronic bilateral low back pain without sciatica    Hiatal hernia    Hypercholesteremia    Parkinson disease (CMS-HCC)    Microcytic anemia    Gastroesophageal reflux disease without esophagitis       Reviewed and up to date in Epic.    Appropriateness of Therapy     Acute infections noted within Epic:  No active infections  Patient reported infection: None    Is medication and dose appropriate based on diagnosis and infection status? Yes    Prescription has been clinically reviewed: Yes      Baseline Quality of Life Assessment      How many days over the past month did your hepatitis C  keep you from your normal activities? For example, brushing your teeth or getting up in the morning. 0    Financial Information     Medication Assistance provided: Prior Authorization    Anticipated copay of $4 reviewed with patient. Verified delivery address.    Delivery Information     Scheduled delivery date: 02/19/22    Expected start date: 02/20/22    Medication will be delivered via Same Day Courier to the prescription address in [facility].  This shipment will not require a signature.      Explained the services we provide at [facility] and that each month we would call to set up refills.  Stressed importance of returning phone calls so that we could ensure they receive their medications in time each month.  Informed patient that we should be setting up refills 7-10 days prior to when they will run out of medication.  A pharmacist will reach out to perform a clinical assessment periodically.  Informed patient that a welcome packet, containing information about our pharmacy and other support services, a Notice of Privacy Practices, and a drug information handout will be sent.      The patient or caregiver noted above participated in the development of this care plan and knows  that they can request review of or adjustments to the care plan at any time.      Patient or caregiver verbalized understanding of the above information as well as how to contact the pharmacy at [phone] option 4 with any questions/concerns.  The pharmacy is open Monday through Friday 8:30am-4:30pm.  A pharmacist is available 24/7 via pager to answer any clinical questions they may have.    Patient Specific Needs     Does the patient have any physical, cognitive, or cultural barriers? 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    Does the patient have adequate living arrangements? (i.e. the ability to store and take their medication appropriately) Yes    Did you identify any home environmental safety or security hazards? No    Patient prefers to have medications discussed with   the Program Manager, [name]      Is the patient or caregiver able to read and understand education materials at a high school level or above? Yes    Patient's primary language is  English     Is the patient high risk? No    SOCIAL DETERMINANTS OF HEALTH     At the [facility], we have learned that life circumstances - like trouble affording food, housing, utilities, or transportation can affect the health of many of our patients.   That is why we wanted to ask: are you currently experiencing any life circumstances that are negatively impacting your health and/or quality of life? Patient declined to answer    Social Determinants of Health     Financial Resource Strain: High Risk (02/04/2022)    Overall Financial Resource Strain (CARDIA)     Difficulty of Paying Living Expenses: Hard   Internet Connectivity: Not on file   Food Insecurity: Food Insecurity Present (02/04/2022)    Hunger Vital Sign     Worried About Running Out of Food in the Last Year: Sometimes true     Ran Out of Food in the Last Year: Never true   Tobacco Use: Medium Risk (02/11/2022)    Patient History     Smoking Tobacco Use: Former Smokeless Tobacco Use: Never     Passive Exposure: Not on file   Housing/Utilities: Low Risk  (02/04/2022)    Housing/Utilities     Within the past 12 months, have you ever stayed: outside, in a car, in a tent, in an overnight shelter, or temporarily in someone else's home (i.e. couch-surfing)?: No     Are you worried about losing your housing?: No     Within the past 12 months, have you been unable to get utilities (heat, electricity) when it was really needed?: No   Alcohol Use: Not on file   Transportation Needs: Unmet Transportation Needs (02/04/2022)    PRAPARE - Therapist, art (Medical): Yes     Lack of Transportation (Non-Medical): Yes   Substance Use: Not on file   Health Literacy: Medium Risk (08/14/2021)    Health Literacy     : Rarely   Physical Activity: Not on file   Interpersonal Safety: Not on file   Stress: Not on file   Intimate Partner Violence: Not on file   Depression: Not at risk (12/26/2021)    PHQ-2     PHQ-2 Score: 2   Social Connections: Not on file       Would you be willing to receive help with any of the needs  that you have identified today? Not applicable       Corliss Skains. [place], [place].D.  Specialty Pharmacist  [facility]  [phone] option 4 then option 2

## 2022-02-19 DIAGNOSIS — B182 Chronic viral hepatitis C: Principal | ICD-10-CM

## 2022-02-19 MED ORDER — SOFOSBUVIR 400 MG-VELPATASVIR 100 MG TABLET
ORAL_TABLET | Freq: Every day | ORAL | 2 refills | 28 days | Status: CP
Start: 2022-02-19 — End: ?
  Filled 2022-02-19: qty 28, 28d supply, fill #0

## 2022-02-21 NOTE — Unmapped (Signed)
I had the pleasure of seeing [name] in consultation at the [facility] Disorders Center in the Department of Neurology at the [facility]     Mark Chaney is seen at the request of Dr. Marcelina Morel, Reinaldo Raddle, MD for evaluation of chorea.       HISTORY OF PRESENT ILLNESS:  Mark Chaney is a 66 y.o.  male seen in consultation at the [facility] at the request of Dr. Misty Stanley for evaluation of Parkinson's Disease. They are requesting my opinion regarding potential etiologies, diagnostic work-up that should be considered, and/or treatment options entertained.      Patient was recently incarcerated and released in 09/2021. He reports being diagnosed with Parkinson's Disease approximately one year ago via a medical clinic associated with the correctional facility. He was started on Sinemet 1 tablet twice daily which has not been alleviating his symptoms. He is observed to have choreiform movements on exam and reports movements first began on year ago with progressive worsening. He first noticed difficulty holding utensils and feeding himself. He endorses diminished memory over the past 2 years. He fell in March 2023 and sustained head trama. Since the fall, he has required a wheelchair and reports that he cannot stand on his own due to inability to bear weight. Endorses urinary incintience over the past 6 months. He has mental health history and endorses hallucinations. He was started on a psychiatric medication but discontinued it over the past year and is unsure if it is was an antipsychotic. He currently lives in a transition house in [place]. Denies family history of neurological conditions. He has two daughters.       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 He is currently living in a transitional house and does have access to a psychiatrist. We had asked to discontinue sinemet and wanted to give trial of tbz which was not covered by insurance but with application to [facility] charity he is able to have TBZ covered. Other labs for chorea including Cbc w. Diff, CMP, Ceruloplasmin, TSH, PTH, Paraneoplastic/Autoimmune encephalopathy panel, Lupus inhibitor panel, Streptococcal antibodies, Pathologist Smear Review were unrevealing. 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     NEUROIMAGING STUDIES:     Outside reports: Available records were personally reviewed by me and details of history and medications changes that were done in the past were noted.     Past Medical History:   Diagnosis Date    GERD (gastroesophageal reflux disease)     Hyperlipidemia     Hypertension      Past Surgical History:   Procedure Laterality Date    NO PAST SURGERIES       Family History   Problem Relation Age of Onset    Chorea Neg Hx      Social History     Socioeconomic History    Marital status: Divorced   Tobacco Use    Smoking status: Former     Types: Cigarettes    Smokeless tobacco: Never     Social Determinants of Health     Financial Resource Strain: High Risk (02/04/2022)    Overall Financial Resource Strain (CARDIA)     Difficulty of Paying Living Expenses: Hard   Food Insecurity: Food Insecurity Present (02/04/2022)    Hunger Vital Sign     Worried  About Running Out of Food in the Last Year: Sometimes true     Ran Out of Food in the Last Year: Never true   Transportation Needs: Unmet Transportation Needs (02/04/2022)    PRAPARE - Therapist, art (Medical): Yes     Lack of Transportation (Non-Medical): Yes       Scheduled Meds:  Prior to Admission medications    Medication Sig Start Date End Date Taking? Authorizing Provider   acetaminophen (TYLENOL EXTRA STRENGTH) 500 MG tablet Take 2 tablets (1,000 mg total) by mouth Three (3) times a day. 01/08/22  Yes Ropero-Cartier, Reinaldo Raddle, MD   amLODIPine (NORVASC) 5 MG tablet Take 1 tablet (5 mg total) by mouth daily. 12/18/21 12/18/22 Yes Ropero-Cartier, Reinaldo Raddle, MD   DULoxetine (CYMBALTA) 60 MG capsule Take 1 capsule (60 mg total) by mouth daily. 10/08/21  Yes Sotolongo, Peter Congo, MD   ferrous sulfate 325 (65 FE) MG EC tablet Take 1 tablet (325 mg total) by mouth in the morning. 11/16/21 11/16/22 Yes Sotolongo, Peter Congo, MD   ibuprofen (MOTRIN) 600 MG tablet Take 1 tablet (600 mg total) by mouth every six (6) hours as needed for pain. 01/08/22  Yes Ropero-Cartier, Reinaldo Raddle, MD   lisinopriL (PRINIVIL,ZESTRIL) 20 MG tablet Take 1 tablet (20 mg total) by mouth daily. 10/08/21  Yes Sotolongo, Peter Congo, MD   metoprolol tartrate (LOPRESSOR) 25 MG tablet Take 1 tablet (25 mg total) by mouth in the morning. 10/08/21  Yes Sotolongo, Peter Congo, MD   pantoprazole (PROTONIX) 40 MG tablet Take 1 tablet (40 mg total) by mouth Two (2) times a day for 14 days, THEN 1 tablet (40 mg total) daily for 28 days. 12/18/21 02/08/22 Yes Ropero-Cartier, Reinaldo Raddle, MD   simvastatin (ZOCOR) 20 MG tablet Take 1 tablet (20 mg total) by mouth nightly. 10/08/21  Yes Sotolongo, Peter Congo, MD   traZODone (DESYREL) 50 MG tablet Take 1 tablet (50 mg total) by mouth nightly. 01/08/22  Yes Ropero-Cartier, Reinaldo Raddle, MD   baclofen (LIORESAL) 10 MG tablet Take 1 tablet (10 mg total) by mouth Three (3) times a day. 02/11/22 02/11/23  Ropero-Cartier, Reinaldo Raddle, MD   famotidine (PEPCID) 20 MG tablet Take 1 tablet (20 mg total) by mouth Two (2) times a day. 02/11/22 02/11/23  Ropero-Cartier, Reinaldo Raddle, MD   magnesium oxide (MAG-OX) 400 mg (241.3 mg elemental magnesium) tablet Take 2 tablets (800 mg total) by mouth daily. 02/11/22   Ropero-Cartier, Reinaldo Raddle, MD   mirtazapine (REMERON) 15 MG tablet Take 1 tablet (15 mg total) by mouth nightly. 02/11/22 02/11/23  Ropero-Cartier, Reinaldo Raddle, MD   sofosbuvir-velpatasvir (EPCLUSA) 400-100 mg tablet Take 1 tablet by mouth daily. 02/19/22   Heron Sabins, MD   tetrabenazine [name]) 12.5 MG tablet Take 1 tablet (12.5 mg total) by mouth Two (2) times a day. 02/11/22 02/11/23  Ropero-Cartier, Reinaldo Raddle, MD         No Known Allergies    Review of Systems:  12  Systems were  review and were negative except for pertinent items noted in the HPI.    Objective:  Vital signs in last 24 hours:  Ht 185.4 cm (6' 1)  - BMI 23.62 kg/m??     Generalized chorea.     Assessment/Plan:  Mark Chaney is a 66 y.o.  male seen in consultation at the [facility] at the request of Dr. Misty Stanley for evaluation  of abnormal involuntary movements. 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 Chorea is bothersome to daily activities and we will give a trial of TBZ to 12.5 mg BID initially. We have encouraged to keep psychiatry appointments. We have made him aware of multidisciplinary clinic resources available to him. Return in 3 months.       -trial of tetrabenazine 12.5 mg BID  -encouraged to maintain psychiatry appointments  -multidisciplinary clinic resources available  -follow up in 3 months    It was a pleasure meeting Mr.  Tavio Chaney.  I will see him in follow up in 3 months.    I have spent 30 minutes in the encounter, over 50% of which was spent in counseling.     Thank you for allowing me to participate in the care of your patient. Please do not hesitate to contact us with any further questions or concerns regarding his care. My phone number is [phone].    Dimas Alexandria, MD   [facility]  [facility]  [place] [facility] [place], [place]

## 2022-03-04 ENCOUNTER — Ambulatory Visit: Admit: 2022-03-04 | Discharge: 2022-03-05 | Payer: PRIVATE HEALTH INSURANCE

## 2022-03-04 DIAGNOSIS — G10 Huntington's disease: Principal | ICD-10-CM

## 2022-03-04 DIAGNOSIS — R440 Auditory hallucinations: Principal | ICD-10-CM

## 2022-03-04 DIAGNOSIS — G255 Other chorea: Principal | ICD-10-CM

## 2022-03-04 MED ORDER — OLANZAPINE 2.5 MG TABLET
ORAL_TABLET | Freq: Every evening | ORAL | 0 refills | 30 days | Status: CP
Start: 2022-03-04 — End: 2022-04-03

## 2022-03-04 NOTE — Unmapped (Signed)
Follow-up instructions:  -- Please continue taking your medications as prescribed for your mental health.   -- Do not make changes to your medications, including taking more or less than prescribed, unless under the supervision of your physician. Be aware that some medications may make you feel worse if abruptly stopped  -- Please refrain from using illicit substances, as these can affect your mood and could cause anxiety or other concerning symptoms.   -- Seek further medical care for any increase in symptoms or new symptoms such as thoughts of wanting to hurt yourself or hurt others.     Contact info:  Life-threatening emergencies: Call 911, the 988 suicide and crisis lifeline, or go to the nearest ER for medical or psychiatric attention.     Issues that need urgent attention but are not life threatening: Call the clinic outpatient front desk for assistance. [phone] for our [facility] clinic located at [address] [facility]. [phone] for our [facility] STEP clinic.    Non-urgent routine concerns and questions: Send a message through [facility] or call our clinic front desk.    Refill requests: Check with your pharmacy to initiate refill requests.    Regarding appointments:  - If you need to cancel your appointment, we ask that you call your clinic at least 24 hours before your scheduled appointment. [phone] for our [facility] clinic located at [address] [facility]. [phone] for our [facility] STEP clinic.  - If for any reason you arrive 15 minutes later than your scheduled appointment time, you may not be seen and your visit may be rescheduled.  - Please remember that we will not automatically reschedule missed appointments.  - If you miss two (2) appointments without letting us know in advance, you will likely be referred to a provider in your community.  - We will do our best to be on time. Sometimes an emergency will arise that might cause your clinician to be late. We will try to inform you of this when you check in for your appointment. If you wait more than 15 minutes past your appointment time without such notice, please speak with the front desk staff.    In the event of bad weather, the clinic staff will attempt to contact you, should your appointment need to be rescheduled. Additionally, you can call the Patient Weather Line [phone] for system-wide clinic status    For more information and reminders regarding clinic policies (these were provided when you were admitted to the clinic), please ask the front desk.

## 2022-03-04 NOTE — Unmapped (Addendum)
[facility]  Psychiatry   Comprehensive New Patient Clinical Assessment - Outpatient      Assessment / Case Formulation:     Mark Chaney presents for evaluation of neuropsychiatric sequela of Huntington's disease (HD). Patient endorses symptoms of auditory and visual hallucinations, paranoia, irritability, restlessness, aggression, disinhibition, decreased concentration, memory loss, and insomnia. Given symptoms of psychosis in addition to severe choreoathetoid movements impairing sleep and overall quality of life, will add olanzapine 2.5mg  nightly to current regimen of tetrabenazine 12.5mg  BID. Will discontinue mirtazapine 15mg  nightly given lack of benefit for insomnia. Of note, tetrabenazine has an increased risk of SI and depression, and is often contraindicated among HD patients with comorbid depression.  Will continue to do thorough suicide risk assessment as SI among HD mutation carriers are as high as 20%.  Patient does not endorse any symptoms of low mood, sadness or dysthymia and denies any suicidal thoughts or plan/intent to harm self. Thus, it is ok to continue tetrabenazine at this time. Will follow up with patient in 2 weeks to assess symptoms of psychosis and chorea with plan to up-titrate olanzapine and/or tetrabenazine as needed. Will continue routine suicide risk assessments and coordinate goals of care discussion with PCP. Will also consider doing a MoCA at upcoming visit as there are concerns for Huntington's dementia.    Risk Assessment:  A suicide and violence risk assessment was performed as part of this evaluation. The patient is deemed to be at chronic elevated risk for self-harm/suicide given the following factors: male age >30, agitation, and chronic severe medical condition. The patient is deemed to be at chronic elevated risk for violence given the following factors: male gender, active symptoms of psychosis, perceives threats in others, and chronic impulsivity. These risk factors are mitigated by the following factors:lack of active SI/HI, motivation for treatment, and safe housing. There is no acute risk for suicide or violence at this time. The patient was educated about relevant modifiable risk factors including following recommendations for treatment of psychiatric illness and abstaining from substance abuse. While future psychiatric events cannot be accurately predicted, the patient does not currently require  acute inpatient psychiatric care and does not currently meet [facility] involuntary commitment criteria.    Plan (including recommendations for additional assessments, services, or support):    Problem: Huntington's disease chorea and neuropsychiatric sequela   Status of problem:  new problem to this provider  Interventions:   -START olanzapine 2.5mg  for symptoms of psychosis, anxiety, insomnia and chorea  -CONTINUE tetrabenazine 12.5mg  BID for symptoms of chorea  -DISCONTINUE mirtazapine 15mg  nightly for sleep  -CONTINUE trazodone 50mg  nightly for sleep  -Will consider doing MoCA at upcoming visit as there are concerns for huntington's dementia    Problem: Chronic bilateral back pain  Status of problem:  new problem to this provider  Interventions:   -CONTINUE duloxetine 60mg  as prescribed by PCP (plans to discuss further tapering this medication given symptoms of psychosis in setting of newly diagnosed huntington's)  - CONTINUE baclofen 10mg  TID as prescribed by PCP      Patient appears to have the ability and capacity to respond to treatment, including patient or guardian understanding the treatment plan.  Patient has been given information on how to contact this clinician for concerns. They have been instructed to call 911 for emergencies.    Patient was seen and plan of care was discussed with the Attending MD,Bottom, who agrees with the above statement and plan.    [name], MD  PGY2      Subjective:    Psychiatric Chief Concern:  Initial evaluation    HPI: Patient is a 66 y.o., Black/African American race, Not Hispanic, Latino/a, or Spanish origin ethnicity,  ENGLISH speaking male  with a history of newly diagnosed Huntington's chorea, chronic hepatitis C, and chronic back pain.    Patient reports that he was he started to develop strange movements two years ago at [facility].  Saw a psychiatrist there as he was feeling depressed about his incarceration. Was put on medication at psychiatrist prior to abnormal movements beginning. Had choreathetoid movents after 6 months of feeling depressed.Recently diagnosed with huntington's disease via genetic testing on 12/26/21.    Neuropsychiatric symptoms of Huntington's disease    Apathy: Used to jog 20 miles, play basketball, and would get physical excersise. No longer can do those activities anymore.  Irritability: Yes, gets easily furstrated and angered  Depression: Feels very very anxious. Feels the world is about to end and your gonna be in it real quick.  Delusions: Denies  Visual Hallucinations: Will awake from sleep and see stepfather. Says stepfather is trying to hold his hands. Sees stepfather once a month.  Auditory Hallucinations: Started hearing voices saying that motherfucker, that's a pretty boy. Started after the movements started. Still hears voices every now and then.  The voices tell him to hurt other folks. Tell him to put rat poison in food. Tell him to put sugar in gasoline in other peopls cars, and to smash other people. Voices go away few minutes. Other people say patient needs anger management, because voices make him angry. During the interview patient reports hearing voices of songs and patient was noted to be singing to songs.  Aggression: People call him shaky, which pisses him off  Anxiety: Feels very very anxious. Feels the world is about to end and your gonna be in it real quick.  Disinhibition: Yes. Pushes chairs around. Not trying to hurt anyone, but takes anger out on chair.  Paranoia: Reports that he feels like people are spying on him. When turns the corner or looks, feels like others are watching him.  OCD: No    Poor judgement: Has not hurt anyone, but states that he has become aggressive trying to get point across.  Inflexibility of thought: Yes  Decreased concentration: Very hard to concerntrate and ability to learn is absolutely zero.   Memory loss: Yes, forget things very easily and forgets where he puts stuff. Forgets where he places things very easily. Cannot remember names or numbers.    SI: Denies any thoughts of not wanting to be alive or that life is not worth living. Denies plan or intent to hurt himself.  HI: Sometimes endorses thoughts of wanting to hurt others when things dont go his way. Does not have plan or intent to hurt others.  Sleep: Is not sleeping, is moving and hitting pillow. Feels tired all of the time and cannot go to sleep. Restless at night, waking up multiple times per night. Only getting 1-2 hours per night of sleep  Appetite: goes and comes. No changes in appetite.  Previous medication trials: Reports mirtazapine, trazodone or tetrabenazine does not help with sleep and has not helped with movements.    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  ADLs: Takes shower by yourself. Sometimes slips and falls and doesn't tell anyone. Is able to  use fork and spoon by himself. Group home does laundry.  No prior psychiatric diagnosis. Has not talked to family about what is going on.    Collateral information  [name]. Manager at [employer] support group transitional housing. [phone], [phone]. Discussed in person for 15 minutes.    He is aggressive sometimes, but not violent. More so agitation, rudeness, sometimes having difficulty expressing himself. Patient likes to be by himself and smokes a lot of cigarretes. Started taking 12.5mg  of tetrabeazine BID. Aggression is dependent on whoever residents are there. Is becoming forgetfull and blaming others. No profanity but a lot of profanity and storming off. He tries his best to maintain his cool. Will go out on porch and smoke cigarettes. He has two daughters, a brother, a sister and a niece. Iantha Fallen encouraged him to talk to family so that they could be tested. Was previously incarcerated for 5 years and 5 months.    Taking duloxetine 60mg , mirtazapine 15mg  nightly, tetrabenazine 12.5mg  BID, and trazodone 50mg  nightly.      Medical/Surgical History:  Past Medical History:   Diagnosis Date    GERD (gastroesophageal reflux disease)     Hyperlipidemia     Hypertension        Past Surgical History:   Procedure Laterality Date    NO PAST SURGERIES         Social History:  Social History     Socioeconomic History    Marital status: Divorced   Tobacco Use    Smoking status: Former     Types: Cigarettes    Smokeless tobacco: Never     Social Determinants of Health     Financial Resource Strain: High Risk (02/04/2022)    Overall Financial Resource Strain (CARDIA)     Difficulty of Paying Living Expenses: Hard   Food Insecurity: Food Insecurity Present (02/04/2022)    Hunger Vital Sign     Worried About Running Out of Food in the Last Year: Sometimes true     Ran Out of Food in the Last Year: Never true   Transportation Needs: Unmet Transportation Needs (02/04/2022)    PRAPARE - Therapist, art (Medical): Yes     Lack of Transportation (Non-Medical): Yes       Family History:  The patient's family history is not on file..      Objective:     Vitals:   Vitals:    03/04/22 0917   BP: 160/85   Pulse: (!) 49       Mental Status Exam:  Appearance:    Appears stated age, Disheveled, and Malodorous   Motor:   Abnormal movements, Severe choreoathetoid movements, lip smacking   Speech/Language:    Dysarthric and Impaired articulation   Mood:   Anxious Affect:   Anxious and Mood congruent   Thought process:   Logical, linear, clear, coherent, goal directed   Thought content:     Endorses paranoia regarding having things taken from him   Perceptual disturbances:     Endorses auditory and visual hallucinations. Responding to internal stimuli in room as he was dancing to a song in his head.     Orientation:   Oriented to person, place, time, and general circumstances   Attention:   Able to fully attend without fluctuations in consciousness   Concentration:   Distractible   Memory:    Subjective defect in short term memory    Fund of knowledge:     UTA  Insight:     Limited   Judgment:    Impaired   Impulse Control:   Impaired       I personally spent 100 minutes face-to-face and non-face-to-face in the care of this patient, which includes all pre, intra, and post visit time on the date of service.  All documented time was specific to the E/M visit and does not include any procedures that may have been performed.      Babette Relic, MD

## 2022-03-07 NOTE — Unmapped (Signed)
[facility] Clinical Assessment & Refill Coordination Note    Mark Chaney, DOB: 1956/05/15  Phone: [phone] (home) [phone] (work)    All above HIPAA information was verified with  Mark Chaney, Program Manager of transitional home      Was a translator used for this call? No    Specialty Medication(s):   Infectious Disease: sofosbuvir/velpatasvir and Neurology: tetrabenazine     Current Outpatient Medications   Medication Sig Dispense Refill    acetaminophen (TYLENOL EXTRA STRENGTH) 500 MG tablet Take 2 tablets (1,000 mg total) by mouth Three (3) times a day. 540 tablet 3    amLODIPine (NORVASC) 5 MG tablet Take 1 tablet (5 mg total) by mouth daily. 30 tablet 3    baclofen (LIORESAL) 10 MG tablet Take 1 tablet (10 mg total) by mouth Three (3) times a day. 90 tablet 1    DULoxetine (CYMBALTA) 60 MG capsule Take 1 capsule (60 mg total) by mouth daily. 60 capsule 2    famotidine (PEPCID) 20 MG tablet Take 1 tablet (20 mg total) by mouth Two (2) times a day. 60 tablet 2    ferrous sulfate 325 (65 FE) MG EC tablet Take 1 tablet (325 mg total) by mouth in the morning. 30 tablet 3    ibuprofen (MOTRIN) 600 MG tablet Take 1 tablet (600 mg total) by mouth every six (6) hours as needed for pain. 360 tablet 3    lisinopriL (PRINIVIL,ZESTRIL) 20 MG tablet Take 1 tablet (20 mg total) by mouth daily. 60 tablet 2    magnesium oxide (MAG-OX) 400 mg (241.3 mg elemental magnesium) tablet Take 2 tablets (800 mg total) by mouth daily. 60 tablet 2    metoprolol tartrate (LOPRESSOR) 25 MG tablet Take 1 tablet (25 mg total) by mouth in the morning. 60 tablet 2    OLANZapine (ZYPREXA) 2.5 MG tablet Take 1 tablet (2.5 mg total) by mouth nightly. 30 tablet 0    simvastatin (ZOCOR) 20 MG tablet Take 1 tablet (20 mg total) by mouth nightly. 60 tablet 2    sofosbuvir-velpatasvir (EPCLUSA) 400-100 mg tablet Take 1 tablet by mouth daily. 28 tablet 2    tetrabenazine (XENAZINE) 12.5 MG tablet Take 1 tablet (12.5 mg total) by mouth Two (2) times a day. 60 tablet 11    traZODone (DESYREL) 50 MG tablet Take 1 tablet (50 mg total) by mouth nightly. 30 tablet 3     No current facility-administered medications for this visit.        Changes to medications: [name] reports that he is currently only taking famotidine 20mg  once daily.  He was receiving the famotidine 4 hours after the Epclusa.       **I told him it is important that the famotidine be given at the same time as Epclusa or 12 hours apart **    No Known Allergies    Changes to allergies: No    SPECIALTY MEDICATION ADHERENCE     sofosbuvir-velpatasvir 400-100 mg: 12 days of medicine on hand after today  tetrabenazine 12.5 mg: 9 days of medicine on hand after today    Medication Adherence    Patient reported X missed doses in the last month: 0  Specialty Medication: Sofosbuvir-velpatasvir 400-100mg   Patient is on additional specialty medications: Yes  Additional Specialty Medications: Tetrabenazine 12.5mg   Patient Reported Additional Medication X Missed Doses in the Last Month: 0  Patient is on more than two specialty medications: No  Demonstrates understanding of  importance of adherence: yes  Informant: caregiver  Reliability of informant: reliable  Provider-estimated medication adherence level: good  Patient is at risk for Non-Adherence: No  Confirmed plan for next specialty medication refill: delivery by pharmacy  Refills needed for supportive medications: not needed       Specialty medication(s) dose(s) confirmed: Regimen is correct and unchanged.     Are there any concerns with adherence? No    Adherence counseling provided? Not needed    CLINICAL MANAGEMENT AND INTERVENTION      Clinical Benefit Assessment:    Do you feel the medicine is effective or helping your condition? Yes    Clinical Benefit counseling provided? Not needed    Adverse Effects Assessment:    Are you experiencing any side effects? No    Are you experiencing difficulty administering your medicine? No    Quality of Life Assessment:      How many days over the past month did your Hepatitis C and Huntington Chorea  keep you from your normal activities? For example, brushing your teeth or getting up in the morning. 0    Have you discussed this with your provider? Not needed    Acute Infection Status:    Acute infections noted within Epic:  No active infections  Patient reported infection: None    Therapy Appropriateness:    Is therapy appropriate and patient progressing towards therapeutic goals? Yes, therapy is appropriate and should be continued    DISEASE/MEDICATION-SPECIFIC INFORMATION      For Hepatitis C patients (clinical assessment):  Regimen: sofosbuvir-velpatasvir x 12 weeks  Therapy start date: 02/20/22  Completed Treatment Week #: 2  What time of day do you take your medicine? 12:30pm  Pill count over the phone revealed #13 tablets (includes dose for today) which is appropriate.    Are you taking any new OTC or herbal medication? No  Any alcoholic beverages? No  Do you have a follow up appointment? No, appointment is not scheduled, I notified clinic and instructed to call [phone] option1, option 1 to schedule an appointment with Dr. Vita Barley.    Hepatitis C: Not Applicable  Other Neurological Condtions: Not Applicable    PATIENT SPECIFIC NEEDS     Does the patient have any physical, cognitive, or cultural barriers? Yes - Patient has Huntington's Disease and is in a wheel chair.     Is the patient high risk? No    Did the patient require a clinical intervention? No    Does the patient require physician intervention or other additional services (i.e., nutrition, smoking cessation, social work)? No    SOCIAL DETERMINANTS OF HEALTH     At the [facility], we have learned that life circumstances - like trouble affording food, housing, utilities, or transportation can affect the health of many of our patients.   That is why we wanted to ask: are you currently experiencing any life circumstances that are negatively impacting your health and/or quality of life? Patient declined to answer    Social Determinants of Health     Financial Resource Strain: High Risk (02/04/2022)    Overall Financial Resource Strain (CARDIA)     Difficulty of Paying Living Expenses: Hard   Internet Connectivity: Not on file   Food Insecurity: Food Insecurity Present (02/04/2022)    Hunger Vital Sign     Worried About Running Out of Food in the Last Year: Sometimes true     Ran Out of Food in the Last Year: Never  true   Tobacco Use: Medium Risk (03/04/2022)    Patient History     Smoking Tobacco Use: Former     Smokeless Tobacco Use: Never     Passive Exposure: Not on file   Housing/Utilities: Low Risk  (02/04/2022)    Housing/Utilities     Within the past 12 months, have you ever stayed: outside, in a car, in a tent, in an overnight shelter, or temporarily in someone else's home (i.e. couch-surfing)?: No     Are you worried about losing your housing?: No     Within the past 12 months, have you been unable to get utilities (heat, electricity) when it was really needed?: No   Alcohol Use: Not on file   Transportation Needs: Unmet Transportation Needs (02/04/2022)    PRAPARE - Therapist, art (Medical): Yes     Lack of Transportation (Non-Medical): Yes   Substance Use: Not on file   Health Literacy: Medium Risk (08/14/2021)    Health Literacy     : Rarely   Physical Activity: Not on file   Interpersonal Safety: Not on file   Stress: Not on file   Intimate Partner Violence: Not on file   Depression: Not at risk (12/26/2021)    PHQ-2     PHQ-2 Score: 2   Social Connections: Not on file       Would you be willing to receive help with any of the needs that you have identified today? Not applicable       SHIPPING     Specialty Medication(s) to be Shipped:   Infectious Disease: sofosbuvir/velpatasvir and Neurology: tetrabenazine    Other medication(s) to be shipped:   Famotidine 20mg   Magnesium oxide 400mg      Changes to insurance: No    Delivery Scheduled: Yes, Expected medication delivery date: 03/12/22.     Medication will be delivered via Same Day Courier to the confirmed prescription address in [facility].    The patient will receive a drug information handout for each medication shipped and additional FDA Medication Guides as required.  Verified that patient has previously received a Conservation officer, historic buildings and a Surveyor, mining.    The patient or caregiver noted above participated in the development of this care plan and knows that they can request review of or adjustments to the care plan at any time.      All of the patient's questions and concerns have been addressed.    [name], PharmD   [facility] Specialty Pharmacist

## 2022-03-12 MED FILL — SOFOSBUVIR 400 MG-VELPATASVIR 100 MG TABLET: ORAL | 28 days supply | Qty: 28 | Fill #1

## 2022-03-12 MED FILL — TETRABENAZINE 12.5 MG TABLET: ORAL | 30 days supply | Qty: 60 | Fill #1

## 2022-03-12 MED FILL — FAMOTIDINE 20 MG TABLET: ORAL | 30 days supply | Qty: 60 | Fill #1

## 2022-03-12 MED FILL — MAGNESIUM OXIDE 400 MG (241.3 MG MAGNESIUM) TABLET: ORAL | 30 days supply | Qty: 60 | Fill #1

## 2022-03-13 ENCOUNTER — Ambulatory Visit
Admit: 2022-03-13 | Payer: PRIVATE HEALTH INSURANCE | Attending: Student in an Organized Health Care Education/Training Program | Primary: Student in an Organized Health Care Education/Training Program

## 2022-03-18 ENCOUNTER — Ambulatory Visit: Admit: 2022-03-18 | Payer: PRIVATE HEALTH INSURANCE

## 2022-03-18 NOTE — Unmapped (Unsigned)
[facility]  Psychiatry   Established Patient E&M Service - Outpatient       Assessment:    Mark Chaney presents for follow-up evaluation.     Identifying Information:  Mark Chaney is a 66 y.o. male with a history of chorea who presented for initial evaluation on 03/04/22 of neuropsychiatric sequela of Huntington's disease (HD). Patient endorsed symptoms of auditory and visual hallucinations, paranoia, irritability, restlessness, aggression, disinhibition, decreased concentration, memory loss, and insomnia. Given symptoms of psychosis in addition to severe choreoathetoid movements impairing sleep and overall quality of life, olanzapine 2.5mg  nightly was started to current regimen of tetrabenazine 12.5mg  BID. Mirtazapine 15mg  nightly was discontinued given lack of benefit for insomnia. Of note, tetrabenazine has an increased risk of SI and depression, and is often contraindicated among HD patients with comorbid depression.  Will continue to do thorough suicide risk assessment as SI among HD mutation carriers are as high as 20%.  Patient did not endorse any symptoms of low mood, sadness or dysthymia and denied any suicidal thoughts or plan/intent to harm self. Thus, it is ok to continue tetrabenazine at this time. Will continue to assess symptoms of psychosis and chorea with plan to up-titrate olanzapine and/or tetrabenazine as needed. Will continue routine suicide risk assessments and coordinate goals of care discussion with PCP. Will also consider doing a MoCA at upcoming visit as there are concerns for Huntington's dementia.     Risk Assessment:  A suicide and violence risk assessment was performed as part of this evaluation. The patient is deemed to be at chronic elevated risk for self-harm/suicide given the following factors: male age >28, agitation, and chronic severe medical condition. The patient is deemed to be at chronic elevated risk for violence given the following factors: male gender, active symptoms of psychosis, perceives threats in others, and chronic impulsivity. These risk factors are mitigated by the following factors:lack of active SI/HI, motivation for treatment, and safe housing. There is no acute risk for suicide or violence at this time. The patient was educated about relevant modifiable risk factors including following recommendations for treatment of psychiatric illness and abstaining from substance abuse. While future psychiatric events cannot be accurately predicted, the patient does not currently require  acute inpatient psychiatric care and does not currently meet [facility] involuntary commitment criteria.    Risk Assessment:  A suicide and violence risk assessment was performed as part of this evaluation. The patient is deemed to be at chronic elevated risk for self-harm/suicide given the following factors: male age >53, agitation, and chronic severe medical condition. The patient is deemed to be at chronic elevated risk for violence given the following factors: male gender, active symptoms of psychosis, perceives threats in others, and chronic impulsivity. These risk factors are mitigated by the following factors:lack of active SI/HI, motivation for treatment, and safe housing. There is no acute risk for suicide or violence at this time. The patient was educated about relevant modifiable risk factors including following recommendations for treatment of psychiatric illness and abstaining from substance abuse. While future psychiatric events cannot be accurately predicted, the patient does not currently require  acute inpatient psychiatric care and does not currently meet [facility] involuntary commitment criteria.      Plan:    Plan (including recommendations for additional assessments, services, or support):     Problem: Huntington's disease chorea and neuropsychiatric sequela   Status of problem:  new problem to this provider  Interventions:   -START olanzapine 2.5mg  for  symptoms of psychosis, anxiety, insomnia and chorea  -CONTINUE tetrabenazine 12.5mg  BID for symptoms of chorea  -DISCONTINUE mirtazapine 15mg  nightly for sleep  -CONTINUE trazodone 50mg  nightly for sleep  -Will consider doing MoCA at upcoming visit as there are concerns for huntington's dementia     Problem: Chronic bilateral back pain  Status of problem:  new problem to this provider  Interventions:   -CONTINUE duloxetine 60mg  as prescribed by PCP (plans to discuss further tapering this medication given symptoms of psychosis in setting of newly diagnosed huntington's)  - CONTINUE baclofen 10mg  TID as prescribed by PCP      Psychotherapy provided:  No billable psychotherapy service provided.    Patient has been given information on how to contact this clinician for concerns. The patient has been instructed to call 911 for emergencies.    Patient and plan of care were discussed with the Attending MD,Bottom, who agrees with the above statement and plan.    [name], MD PGY2      Subjective:    Interval History:     Follow up 03/18/22:    Neuropsychiatric symptoms of Huntington's disease (initial eval 03/04/22)     Apathy: Used to jog 20 miles, play basketball, and would get physical excersise. No longer can do those activities anymore.  Irritability: Yes, gets easily furstrated and angered  Depression: Feels very very anxious. Feels the world is about to end and your gonna be in it real quick.  Delusions: Denies  Visual Hallucinations: Will awake from sleep and see stepfather. Says stepfather is trying to hold his hands. Sees stepfather once a month.  Auditory Hallucinations: Started hearing voices saying that motherfucker, that's a pretty boy. Started after the movements started. Still hears voices every now and then.  The voices tell him to hurt other folks. Tell him to put rat poison in food. Tell him to put sugar in gasoline in other peopls cars, and to smash other people. Voices go away few minutes. Other people say patient needs anger management, because voices make him angry. During the interview patient reports hearing voices of songs and patient was noted to be singing to songs.  Aggression: People call him shaky, which pisses him off  Anxiety: Feels very very anxious. Feels the world is about to end and your gonna be in it real quick.  Disinhibition: Yes. Pushes chairs around. Not trying to hurt anyone, but takes anger out on chair.  Paranoia: Reports that he feels like people are spying on him. When turns the corner or looks, feels like others are watching him.  OCD: No     Poor judgement: Has not hurt anyone, but states that he has become aggressive trying to get point across.  Inflexibility of thought: Yes  Decreased concentration: Very hard to concerntrate and ability to learn is absolutely zero.   Memory loss: Yes, forget things very easily and forgets where he puts stuff. Forgets where he places things very easily. Cannot remember names or numbers.     SI: Denies any thoughts of not wanting to be alive or that life is not worth living. Denies plan or intent to hurt himself.  HI: Sometimes endorses thoughts of wanting to hurt others when things dont go his way. Does not have plan or intent to hurt others.  Sleep: Is not sleeping, is moving and hitting pillow. Feels tired all of the time and cannot go to sleep. Restless at night, waking up multiple times per night. Only getting 1-2  hours per night of sleep  Appetite: goes and comes. No changes in appetite.  Previous medication trials: Reports mirtazapine, trazodone or tetrabenazine does not help with sleep and has not helped with movements.     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  ADLs: Takes shower by yourself. Sometimes slips and falls and doesn't tell anyone. Is able to use fork and spoon by himself. Group home does laundry.  No prior psychiatric diagnosis. Has not talked to family about what is going on.     Collateral information  [name]. Manager at [employer] support group transitional housing. [phone], [phone]. Discussed in person for 15 minutes.     He is aggressive sometimes, but not violent. More so agitation, rudeness, sometimes having difficulty expressing himself. Patient likes to be by himself and smokes a lot of cigarretes. Started taking 12.5mg  of tetrabeazine BID. Aggression is dependent on whoever residents are there. Is becoming forgetfull and blaming others. No profanity but a lot of profanity and storming off. He tries his best to maintain his cool. Will go out on porch and smoke cigarettes. He has two daughters, a brother, a sister and a niece. Iantha Fallen encouraged him to talk to family so that they could be tested. Was previously incarcerated for 5 years and 5 months.     Taking duloxetine 60mg , mirtazapine 15mg  nightly, tetrabenazine 12.5mg  BID, and trazodone 50mg  nightly.      Objective:                            Appearance:   Appears stated age, Disheveled, and Malodorous   Motor:    Abnormal movements, Severe choreoathetoid movements, lip smacking   Speech/Language:     Dysarthric and Impaired articulation   Mood:    Anxious   Affect:    Anxious and Mood congruent   Thought process:    Logical, linear, clear, coherent, goal directed   Thought content:      Endorses paranoia regarding having things taken from him   Perceptual disturbances:      Endorses auditory and visual hallucinations. Responding to internal stimuli in room as he was dancing to a song in his head.      Orientation:    Oriented to person, place, time, and general circumstances   Attention:    Able to fully attend without fluctuations in consciousness   Concentration:    Distractible   Memory:     Subjective defect in short term memory    Fund of knowledge:      UTA   Insight:      Limited   Judgment:     Impaired   Impulse Control:    Impaired           Babette Relic, MD

## 2022-03-20 NOTE — Unmapped (Signed)
Follow-Up Counseling for HCV Treatment      Regimen: Epclusa (sofosbuvir/velpatasvir 400/100mg ) x 12 weeks  Start Date: 02/21/22 per Mr. Laural Benes, program manager at [facility] transitional home [facility]  Completed Treatment Week #4    Pharmacy: [facility] [phone] option #4, then option 2    HIPAA information was verified with Mr. Laural Benes, program manager at [facility] transitional home.  Following topics were reviewed during the phone call:    1. Medication appropriateness and administration - Takes Epclusa daily. Regimen is correct and unchanged.     2. Importance of adherence - Pill count over the phone revealed #1 tablets which is 1 more than expected. Mr. Laural Benes is unsure how pt missed 1 dose but he will instruct his staff regarding importance of adherence. Advised patient to call pharmacy when down to about 7-14 day supply left to ensure there's no interruption in therapy. Mr. Laural Benes also let me know that Mr. Chrissie Noa may be moving in November. Requested he let us know when pt is leaving so we can arrange refill to to be shipped before pt leaves the facility. He's in agreement.    3. Side effects - No new side effects to report.    4. Changes to medications: Montie reports no changes reported at this time. Denies alcohol.     5. Follow up - Does not have a follow up appointment scheduled in HCV treatment clinic.  Informed that there is still a small chance of relapse after finishing the treatment. Stressed importance of follow up 3 months post treatment to assess for cure. Will request scheduling to arrange.   All questions were answered.    Park Breed, Pharm D., BCPS, BCGP, CPP  [facility]  [address]  [place], [place] [address]  [phone]    March 20, 2022 11:42 AM

## 2022-03-25 ENCOUNTER — Ambulatory Visit: Admit: 2022-03-25 | Discharge: 2022-03-26 | Payer: PRIVATE HEALTH INSURANCE

## 2022-03-25 DIAGNOSIS — R440 Auditory hallucinations: Principal | ICD-10-CM

## 2022-03-25 DIAGNOSIS — G10 Huntington's disease: Principal | ICD-10-CM

## 2022-03-25 MED ORDER — OLANZAPINE 5 MG TABLET
ORAL_TABLET | Freq: Every evening | ORAL | 2 refills | 30 days | Status: CP
Start: 2022-03-25 — End: 2022-06-23

## 2022-03-25 NOTE — Unmapped (Signed)
[facility]  Psychiatry   Established Patient E&M Service - Outpatient         Assessment:     Mark Chaney presents for follow-up evaluation.  On assessment today, there is moderate improvement with symptoms of chorea in addition to auditory hallucinations, irritability, and agitation.  Olanzapine thus seems to be beneficial not only for chorea but also the neuropsychiatric sequela of Huntington's disease.  We will thus increase olanzapine to 5 mg nightly from 2.5 mg.  We will discontinue trazodone at this time as it is not helping with sleep.  A higher dose of olanzapine will hopefully help with insomnia as well.  Discussed with patient and caregiver to have goals of care discussion with neurologist or primary care physician.  Filled out FL2 paperwork for SNF placement during this visit as well.  We will follow-up with patient in 1 month.      Identifying Information:  Mark Chaney is a 66 y.o. male with a history of chorea who presented for initial evaluation on 03/04/22 of neuropsychiatric sequela of Huntington's disease (HD). Patient endorsed symptoms of auditory and visual hallucinations, paranoia, irritability, restlessness, aggression, disinhibition, decreased concentration, memory loss, and insomnia. Given symptoms of psychosis in addition to severe choreoathetoid movements impairing sleep and overall quality of life, olanzapine 2.5mg  nightly was started to current regimen of tetrabenazine 12.5mg  BID. Mirtazapine 15mg  nightly was discontinued given lack of benefit for insomnia. Of note, tetrabenazine has an increased risk of SI and depression, and is often contraindicated among HD patients with comorbid depression.  Will continue to do thorough suicide risk assessment as SI among HD mutation carriers are as high as 20%.  Patient did not endorse any symptoms of low mood, sadness or dysthymia and denied any suicidal thoughts or plan/intent to harm self. Thus, it was considered ok to continue tetrabenazine. Will continue to assess symptoms of psychosis and chorea with plan to up-titrate olanzapine and/or tetrabenazine as needed. Will continue routine suicide risk assessments and coordinate goals of care discussion with PCP. Will also consider doing a MoCA at upcoming visit as there are concerns for Huntington's dementia.        Risk Assessment:  A suicide and violence risk assessment was performed as part of this evaluation. The patient is deemed to be at chronic elevated risk for self-harm/suicide given the following factors: male age >20, agitation, and chronic severe medical condition. The patient is deemed to be at chronic elevated risk for violence given the following factors: male gender, active symptoms of psychosis, perceives threats in others, and chronic impulsivity. These risk factors are mitigated by the following factors:lack of active SI/HI, motivation for treatment, and safe housing. There is no acute risk for suicide or violence at this time. The patient was educated about relevant modifiable risk factors including following recommendations for treatment of psychiatric illness and abstaining from substance abuse. While future psychiatric events cannot be accurately predicted, the patient does not currently require  acute inpatient psychiatric care and does not currently meet [facility] involuntary commitment criteria.       Plan:     Plan (including recommendations for additional assessments, services, or support):     Problem: Huntington's disease chorea and neuropsychiatric sequela   Status of problem:  new problem to this provider  Interventions:   -INCREASE olanzapine to 5mg  (from 2.5mg ) for symptoms of psychosis, anxiety, insomnia and chorea  -CONTINUE tetrabenazine 12.5mg  BID for symptoms of chorea  -DISCONTINUE trazodone 50mg  nightly for sleep  -  Will consider doing MoCA at upcoming visit as there are concerns for huntington's dementia    mirtazapine 15mg  nightly for sleep discontinued on 10/9     Problem: Chronic bilateral back pain  Status of problem:  new problem to this provider  Interventions:   -CONTINUE duloxetine 60mg  as prescribed by PCP (plans to discuss further tapering this medication given symptoms of psychosis in setting of newly diagnosed huntington's)  - CONTINUE baclofen 10mg  TID as prescribed by PCP (not taking)        Psychotherapy provided:  No billable psychotherapy service provided.     Patient has been given information on how to contact this clinician for concerns. The patient has been instructed to call 911 for emergencies.     Patient and plan of care were discussed with the Attending MD,Bottom, who agrees with the above statement and plan.     [name], MD PGY2        Subjective:     Interval History:      Follow up 03/24/22:     Patient was interviewed along with his caregiver in person.  On exam, patient has noticeable improvement with his chorea.  His movements seem less pronounced and he is no longer banging his head against the wall involuntarily as he was at his last visit.  Patient and caregiver noticed significant improvement after starting Zyprexa not only with a reduction in involuntary movements, but also improvement with auditory hallucinations, irritability and agitation.  He is able to get around in his wheelchair better.  Appetite has slightly increased after starting olanzapine.  Patient denies any SI or HI and does not feel depressed or down.  He is hearing voices once or twice a week which is slightly improved from previous.  Patient still reports difficulty sleeping at night, however caregivers noting that he is still sleeping a few hours every night.  Filled out FL2 form for patient as he is being referred to [facility].  Discussed that it would be beneficial to have goals of care discussion with neurology rather soon and to make an appointment prior to his February appointment with neurology.  Patient recently missed an appointment with telemedicine but will schedule to make an appointment as well to discuss goals of care.    Initial assessment from 03/04/22     Apathy: Used to jog 20 miles, play basketball, and would get physical excersise. No longer can do those activities anymore.  Irritability: Yes, gets easily furstrated and angered  Depression: Feels very very anxious. Feels the world is about to end and your gonna be in it real quick.  Delusions: Denies  Visual Hallucinations: Will awake from sleep and see stepfather. Says stepfather is trying to hold his hands. Sees stepfather once a month.  Auditory Hallucinations: Started hearing voices saying that motherfucker, that's a pretty boy. Started after the movements started. Still hears voices every now and then.  The voices tell him to hurt other folks. Tell him to put rat poison in food. Tell him to put sugar in gasoline in other peopls cars, and to smash other people. Voices go away few minutes. Other people say patient needs anger management, because voices make him angry. During the interview patient reports hearing voices of songs and patient was noted to be singing to songs.  Aggression: People call him shaky, which pisses him off  Anxiety: Feels very very anxious. Feels the world is about to end and your gonna be in it real quick.  Disinhibition: Yes. Pushes chairs around. Not trying to hurt anyone, but takes anger out on chair.  Paranoia: Reports that he feels like people are spying on him. When turns the corner or looks, feels like others are watching him.  OCD: No     Poor judgement: Has not hurt anyone, but states that he has become aggressive trying to get point across.  Inflexibility of thought: Yes  Decreased concentration: Very hard to concerntrate and ability to learn is absolutely zero.   Memory loss: Yes, forget things very easily and forgets where he puts stuff. Forgets where he places things very easily. Cannot remember names or numbers. SI: Denies any thoughts of not wanting to be alive or that life is not worth living. Denies plan or intent to hurt himself.  HI: Sometimes endorses thoughts of wanting to hurt others when things dont go his way. Does not have plan or intent to hurt others.  Sleep: Is not sleeping, is moving and hitting pillow. Feels tired all of the time and cannot go to sleep. Restless at night, waking up multiple times per night. Only getting 1-2 hours per night of sleep  Appetite: goes and comes. No changes in appetite.  Previous medication trials: Reports mirtazapine, trazodone or tetrabenazine does not help with sleep and has not helped with movements.     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  ADLs: Takes shower by yourself. Sometimes slips and falls and doesn't tell anyone. Is able to use fork and spoon by himself. Group home does laundry.  No prior psychiatric diagnosis. Has not talked to family about what is going on.     Collateral information  [name]. Manager at [employer] support group transitional housing. [phone], [phone]. Discussed in person for 15 minutes.     He is aggressive sometimes, but not violent. More so agitation, rudeness, sometimes having difficulty expressing himself. Patient likes to be by himself and smokes a lot of cigarretes. Started taking 12.5mg  of tetrabeazine BID. Aggression is dependent on whoever residents are there. Is becoming forgetfull and blaming others. No profanity but a lot of profanity and storming off. He tries his best to maintain his cool. Will go out on porch and smoke cigarettes. He has two daughters, a brother, a sister and a niece. Iantha Fallen encouraged him to talk to family so that they could be tested. Was previously incarcerated for 5 years and 5 months.     Taking duloxetine 60mg , mirtazapine 15mg  nightly, tetrabenazine 12.5mg  BID, and trazodone 50mg  nightly.        Objective:                              Appearance:    Appears stated age, Disheveled, and Malodorous   Motor:    Abnormal movements, choreoathetoid movements, lip smacking, improving   Speech/Language:     Dysarthric and Impaired articulation   Mood:    Anxious, improving   Affect:    Anxious and Mood congruent   Thought process:    Logical, linear, clear, coherent, goal directed   Thought content:      Endorses paranoia regarding having things taken from him, improving   Perceptual disturbances:      Endorses auditory and visual hallucinations., improving      Orientation:    Oriented to person, place, time, and general circumstances  Attention:    Able to fully attend without fluctuations in consciousness   Concentration:    Distractible   Memory:     Subjective defect in short term memory    Fund of knowledge:      UTA   Insight:      Limited   Judgment:     Impaired   Impulse Control:    Impaired

## 2022-03-25 NOTE — Unmapped (Signed)
Follow-up instructions:  -- Please continue taking your medications as prescribed for your mental health.   -- Do not make changes to your medications, including taking more or less than prescribed, unless under the supervision of your physician. Be aware that some medications may make you feel worse if abruptly stopped  -- Please refrain from using illicit substances, as these can affect your mood and could cause anxiety or other concerning symptoms.   -- Seek further medical care for any increase in symptoms or new symptoms such as thoughts of wanting to hurt yourself or hurt others.     Contact info:  Life-threatening emergencies: Call 911, the 988 suicide and crisis lifeline, or go to the nearest ER for medical or psychiatric attention.     Issues that need urgent attention but are not life threatening: Call the clinic outpatient front desk for assistance. [phone] for our [facility] clinic located at [address] [facility]. [phone] for our [facility] STEP clinic.    Non-urgent routine concerns and questions: Send a message through [facility] or call our clinic front desk.    Refill requests: Check with your pharmacy to initiate refill requests.    Regarding appointments:  - If you need to cancel your appointment, we ask that you call your clinic at least 24 hours before your scheduled appointment. [phone] for our [facility] clinic located at [address] [facility]. [phone] for our [facility] STEP clinic.  - If for any reason you arrive 15 minutes later than your scheduled appointment time, you may not be seen and your visit may be rescheduled.  - Please remember that we will not automatically reschedule missed appointments.  - If you miss two (2) appointments without letting us know in advance, you will likely be referred to a provider in your community.  - We will do our best to be on time. Sometimes an emergency will arise that might cause your clinician to be late. We will try to inform you of this when you check in for your appointment. If you wait more than 15 minutes past your appointment time without such notice, please speak with the front desk staff.    In the event of bad weather, the clinic staff will attempt to contact you, should your appointment need to be rescheduled. Additionally, you can call the Patient Weather Line [phone] for system-wide clinic status    For more information and reminders regarding clinic policies (these were provided when you were admitted to the clinic), please ask the front desk.

## 2022-03-26 NOTE — Unmapped (Signed)
Appointment Request From: Gerome Sam      With Provider: Dimas Alexandria, MD [facility] [address] [place]]      Preferred Date Range: 04/09/2022 - 04/12/2022      Preferred Times: Tuesday Morning      Reason for visit: Request an Appointment      Comments:   Recent changes in medicine and conversation with Dr. [name]

## 2022-04-05 ENCOUNTER — Ambulatory Visit
Admit: 2022-04-05 | Discharge: 2022-04-06 | Payer: PRIVATE HEALTH INSURANCE | Attending: Student in an Organized Health Care Education/Training Program | Primary: Student in an Organized Health Care Education/Training Program

## 2022-04-05 MED ORDER — DIAPER,BRIEF,ADULT,DISPOSABLE
Freq: Every day | 6 refills | 0 days | Status: CP | PRN
Start: 2022-04-05 — End: ?

## 2022-04-05 NOTE — Unmapped (Signed)
[facility] Clinical Assessment & Refill Coordination Note    Arney Drace, DOB: [birth year]  Phone: [phone] (home) [phone] (work)    All above HIPAA information was verified with [name], Program Manager at the [employer]      Was a Nurse, learning disability used for this call? No    Specialty Medication(s):   Infectious Disease: sofosbuvir/velpatasvir and Neurology: tetrabenazine     Current Outpatient Medications   Medication Sig Dispense Refill    acetaminophen (TYLENOL EXTRA STRENGTH) 500 MG tablet Take 2 tablets (1,000 mg total) by mouth Three (3) times a day. 540 tablet 3    amLODIPine (NORVASC) 5 MG tablet Take 1 tablet (5 mg total) by mouth daily. 30 tablet 3    baclofen (LIORESAL) 10 MG tablet Take 1 tablet (10 mg total) by mouth Three (3) times a day. 90 tablet 1    diaper,brief,adult,disposable Misc 1 each by Miscellaneous route as needed. 96 each 6    DULoxetine (CYMBALTA) 60 MG capsule Take 1 capsule (60 mg total) by mouth daily. 60 capsule 2    famotidine (PEPCID) 20 MG tablet Take 1 tablet (20 mg total) by mouth Two (2) times a day. 60 tablet 2    ferrous sulfate 325 (65 FE) MG EC tablet Take 1 tablet (325 mg total) by mouth in the morning. 30 tablet 3    ibuprofen (MOTRIN) 600 MG tablet Take 1 tablet (600 mg total) by mouth every six (6) hours as needed for pain. 360 tablet 3    lisinopriL (PRINIVIL,ZESTRIL) 20 MG tablet Take 1 tablet (20 mg total) by mouth daily. 60 tablet 2    magnesium oxide (MAG-OX) 400 mg (241.3 mg elemental magnesium) tablet Take 2 tablets (800 mg total) by mouth daily. 60 tablet 2    metoprolol tartrate (LOPRESSOR) 25 MG tablet Take 1 tablet (25 mg total) by mouth in the morning. 60 tablet 2    OLANZapine (ZYPREXA) 5 MG tablet Take 1 tablet (5 mg total) by mouth nightly. 30 tablet 2    simvastatin (ZOCOR) 20 MG tablet Take 1 tablet (20 mg total) by mouth nightly. 60 tablet 2    sofosbuvir-velpatasvir (EPCLUSA) 400-100 mg tablet Take 1 tablet by mouth daily. 28 tablet 2    tetrabenazine (XENAZINE) 12.5 MG tablet Take 1 tablet (12.5 mg total) by mouth Two (2) times a day. 60 tablet 11     No current facility-administered medications for this visit.        Changes to medications:  no changes    No Known Allergies    Changes to allergies: No    SPECIALTY MEDICATION ADHERENCE     Sofosbuvir-velatasvir 400-100 mg: unable to verify the number days of medicine on hand.  [name] said he will call me back with a pill count.  tetrabenazine 12.5 mg: unable to verify the number of  days of medicine on hand.  [name] said he will call me back with a pill count.    Medication Adherence    Patient reported X missed doses in the last month: 0  Specialty Medication: Sofosbuvir-velpatasvir 400-100mg   Patient is on additional specialty medications: Yes  Additional Specialty Medications: Tetrabenazine 12.5mg   Patient Reported Additional Medication X Missed Doses in the Last Month: 0  Patient is on more than two specialty medications: No  Any gaps in refill history greater than 2 weeks in the last 3 months: no  Demonstrates understanding of importance of adherence: yes  Informant:  caregiver  Reliability of informant: reliable  Provider-estimated medication adherence level: good  Patient is at risk for Non-Adherence: No      Specialty medication(s) dose(s) confirmed: Regimen is correct and unchanged.     Are there any concerns with adherence? No    Adherence counseling provided? Not needed    CLINICAL MANAGEMENT AND INTERVENTION      Clinical Benefit Assessment:    Do you feel the medicine is effective or helping your condition? Yes    Clinical Benefit counseling provided? Not needed    Adverse Effects Assessment:    Are you experiencing any side effects? No    Are you experiencing difficulty administering your medicine? No    Quality of Life Assessment:    How many days over the past month did your Hepatitis C and Huntington's Chorea  keep you from your normal activities? For example, brushing your teeth or getting up in the morning. 0    Have you discussed this with your provider? Not needed    Acute Infection Status:    Acute infections noted within Epic:  No active infections  Patient reported infection: None    Therapy Appropriateness:    Is therapy appropriate and patient progressing towards therapeutic goals? Yes, therapy is appropriate and should be continued    DISEASE/MEDICATION-SPECIFIC INFORMATION      For Hepatitis C patients (clinical assessment):  Regimen: sofosbuvir-velpatasvir x 12 weeks  Therapy start date: 02/21/22  Completed Treatment Week #: 6  What time of day do you take your medicine? 12:30pm  Unable to obtain a pill count over the phone.  [name], Program Manager, said he will call back with a pill count.  Are you taking any new OTC or herbal medication? No  Any alcoholic beverages? No    Hepatitis C: Not Applicable  Other Neurological Condtions: Not Applicable    PATIENT SPECIFIC NEEDS     Does the patient have any physical, cognitive, or cultural barriers? 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 No    Did the patient require a clinical intervention? No    Does the patient require physician intervention or other additional services (i.e., nutrition, smoking cessation, social work)? No    SOCIAL DETERMINANTS OF HEALTH     At the [facility], we have learned that life circumstances - like trouble affording food, housing, utilities, or transportation can affect the health of many of our patients.   That is why we wanted to ask: are you currently experiencing any life circumstances that are negatively impacting your health and/or quality of life? Patient declined to answer    Social Determinants of Health     Financial Resource Strain: High Risk (02/04/2022)    Overall Financial Resource Strain (CARDIA)     Difficulty of Paying Living Expenses: Hard   Internet Connectivity: Not on file   Food Insecurity: Food Insecurity Present (02/04/2022)    Hunger Vital Sign     Worried About Running Out of Food in the Last Year: Sometimes true     Ran Out of Food in the Last Year: Never true   Tobacco Use: Medium Risk (03/25/2022)    Patient History     Smoking Tobacco Use: Former     Smokeless Tobacco Use: Never     Passive Exposure: Not on file   Housing/Utilities: Low Risk  (02/04/2022)    Housing/Utilities  Within the past 12 months, have you ever stayed: outside, in a car, in a tent, in an overnight shelter, or temporarily in someone else's home (i.e. couch-surfing)?: No     Are you worried about losing your housing?: No     Within the past 12 months, have you been unable to get utilities (heat, electricity) when it was really needed?: No   Alcohol Use: Not on file   Transportation Needs: Unmet Transportation Needs (02/04/2022)    PRAPARE - Therapist, art (Medical): Yes     Lack of Transportation (Non-Medical): Yes   Substance Use: Not on file   Health Literacy: Medium Risk (08/14/2021)    Health Literacy     : Rarely   Physical Activity: Not on file   Interpersonal Safety: Not on file   Stress: Not on file   Intimate Partner Violence: Not on file   Depression: Not at risk (12/26/2021)    PHQ-2     PHQ-2 Score: 2   Social Connections: Not on file       Would you be willing to receive help with any of the needs that you have identified today? Not applicable       SHIPPING     Specialty Medication(s) to be Shipped:   Infectious Disease: sofosbuvir/velpatasvir and Neurology: tetrabenazine    Other medication(s) to be shipped:  famotidine 20mg  and magnesium oxide 400mg      Changes to insurance: No    Delivery Scheduled: Yes, Expected medication delivery date: 04/08/22.     Medication will be delivered via Same Day Courier to the confirmed prescription address in [facility].    The patient will receive a drug information handout for each medication shipped and additional FDA Medication Guides as required.  Verified that patient has previously received a Conservation officer, historic buildings and a Surveyor, mining.    The patient or caregiver noted above participated in the development of this care plan and knows that they can request review of or adjustments to the care plan at any time.      All of the patient's questions and concerns have been addressed.    [name], PharmD   [facility] Shared [facility] Specialty Pharmacist

## 2022-04-05 NOTE — Unmapped (Unsigned)
[facility]  Establ[facility] Patient Clinic Note    Assessment/Plan:   MarkMark Chaney is a 66 y.o.male        Subjective   Mark Chaney is a 66 y.o. male  coming to clinic today for the following issues:    Chief Complaint   Patient presents with    right knee pain     HPI:    R knee pain        Huntingtons Chorea    - needs diaper orders, using 2x         I have reviewed the problem list, medications, and allergies and have updated/reconciled them if needed.    Mark Chaney  reports that he has quit smoking. His smoking use included cigarettes. He has never used smokeless tobacco.  Health Maintenance   Topic Date Due    Pneumococcal Vaccine 0-64 (1 - PCV) Never done    Colon Cancer Screening  Never done    Zoster Vaccines (1 of 2) Never done    Serum Creatinine Monitoring  12/19/2022    Potassium Monitoring  12/19/2022    DTaP/Tdap/Td Vaccines (2 - Td or Tdap) 10/10/2028    COVID-19 Vaccine  Completed    Influenza Vaccine  Completed       Objective     VITALS: BP 136/76  - Pulse 78  - Temp 36.1 ??C (96.9 ??F) (Temporal)     Physical Exam    LABS/IMAGING  I have reviewed pertinent recent labs and imaging in Epic    [facility]  [place] [facility] [place] [facility]  [address] [place], [place] [address]  Telephone [phone]  Fax [phone]  [email or web address]

## 2022-04-05 NOTE — Unmapped (Signed)
Specialty Medication(s): sofosbuvir-velpatasvir 400-100mg     Mark Chaney has been dis-enrolled from the [facility] specialty pharmacy services for Hepatitis C treatment due to therapy completion - expected therapy completion date: 05/15/22.  His last shipment will be delivered on 04/08/22 .    He will be re-enrolled in the General Neurology specialty pharmacy queue for continued maintenance of his Huntington's Chorea with tetrabenazine.    Additional information provided to the patient: n/a    [name], PharmD  [facility] Specialty Pharmacist

## 2022-04-08 MED FILL — SOFOSBUVIR 400 MG-VELPATASVIR 100 MG TABLET: ORAL | 28 days supply | Qty: 28 | Fill #2

## 2022-04-08 MED FILL — MAGNESIUM OXIDE 400 MG (241.3 MG MAGNESIUM) TABLET: ORAL | 30 days supply | Qty: 60 | Fill #2

## 2022-04-08 MED FILL — TETRABENAZINE 12.5 MG TABLET: ORAL | 30 days supply | Qty: 60 | Fill #2

## 2022-04-08 MED FILL — FAMOTIDINE 20 MG TABLET: ORAL | 30 days supply | Qty: 60 | Fill #2

## 2022-04-15 ENCOUNTER — Ambulatory Visit: Admit: 2022-04-15 | Discharge: 2022-04-16 | Payer: PRIVATE HEALTH INSURANCE

## 2022-04-15 DIAGNOSIS — G10 Huntington's disease: Principal | ICD-10-CM

## 2022-04-15 DIAGNOSIS — Z79899 Other long term (current) drug therapy: Principal | ICD-10-CM

## 2022-04-15 MED ORDER — OLANZAPINE 7.5 MG TABLET
ORAL_TABLET | Freq: Every evening | ORAL | 2 refills | 30 days | Status: CP
Start: 2022-04-15 — End: 2022-07-14

## 2022-05-06 DIAGNOSIS — B182 Chronic viral hepatitis C: Principal | ICD-10-CM

## 2022-05-06 MED ORDER — SOFOSBUVIR 400 MG-VELPATASVIR 100 MG TABLET
ORAL_TABLET | Freq: Every day | ORAL | 2 refills | 28 days
Start: 2022-05-06 — End: ?

## 2022-05-07 ENCOUNTER — Ambulatory Visit: Admit: 2022-05-07 | Payer: PRIVATE HEALTH INSURANCE

## 2022-05-13 MED ORDER — SOFOSBUVIR 400 MG-VELPATASVIR 100 MG TABLET
ORAL_TABLET | Freq: Every day | ORAL | 2 refills | 28 days
Start: 2022-05-13 — End: ?

## 2022-05-22 ENCOUNTER — Ambulatory Visit: Admit: 2022-05-22 | Discharge: 2022-05-23 | Payer: PRIVATE HEALTH INSURANCE

## 2022-05-22 DIAGNOSIS — Z79899 Other long term (current) drug therapy: Principal | ICD-10-CM

## 2022-05-22 DIAGNOSIS — G10 Huntington's disease: Principal | ICD-10-CM

## 2022-05-22 DIAGNOSIS — R440 Auditory hallucinations: Principal | ICD-10-CM

## 2022-05-22 MED ORDER — OLANZAPINE 10 MG TABLET
ORAL_TABLET | Freq: Every evening | ORAL | 2 refills | 30 days | Status: CP
Start: 2022-05-22 — End: 2022-08-20

## 2022-05-23 MED ORDER — MAGNESIUM OXIDE 400 MG (241.3 MG MAGNESIUM) TABLET
ORAL_TABLET | Freq: Every day | ORAL | 2 refills | 30 days
Start: 2022-05-23 — End: ?

## 2022-05-23 MED ORDER — FAMOTIDINE 20 MG TABLET
ORAL_TABLET | Freq: Two times a day (BID) | ORAL | 2 refills | 30 days
Start: 2022-05-23 — End: 2023-05-23

## 2022-05-23 MED FILL — TETRABENAZINE 12.5 MG TABLET: ORAL | 30 days supply | Qty: 60 | Fill #3

## 2022-05-24 MED ORDER — FAMOTIDINE 20 MG TABLET
ORAL_TABLET | Freq: Two times a day (BID) | ORAL | 10 refills | 30 days | Status: CP
Start: 2022-05-24 — End: 2023-05-24
  Filled 2022-05-29: qty 180, 90d supply, fill #0

## 2022-05-24 MED ORDER — MAGNESIUM OXIDE 400 MG (241.3 MG MAGNESIUM) TABLET
ORAL_TABLET | Freq: Every day | ORAL | 10 refills | 30 days | Status: CP
Start: 2022-05-24 — End: ?
  Filled 2022-05-29: qty 180, 90d supply, fill #0

## 2022-06-18 ENCOUNTER — Ambulatory Visit
Admit: 2022-06-18 | Payer: PRIVATE HEALTH INSURANCE | Attending: Student in an Organized Health Care Education/Training Program | Primary: Student in an Organized Health Care Education/Training Program

## 2022-06-25 ENCOUNTER — Ambulatory Visit
Admit: 2022-06-25 | Discharge: 2022-06-26 | Payer: PRIVATE HEALTH INSURANCE | Attending: Student in an Organized Health Care Education/Training Program | Primary: Student in an Organized Health Care Education/Training Program

## 2022-06-25 MED ORDER — OXYBUTYNIN CHLORIDE ER 5 MG TABLET,EXTENDED RELEASE 24 HR
ORAL_TABLET | Freq: Every day | ORAL | 2 refills | 30 days | Status: CP
Start: 2022-06-25 — End: ?

## 2022-06-25 MED ORDER — FAMOTIDINE 20 MG TABLET
ORAL_TABLET | Freq: Two times a day (BID) | ORAL | 3 refills | 90 days | Status: CP
Start: 2022-06-25 — End: ?

## 2022-06-25 MED FILL — TETRABENAZINE 12.5 MG TABLET: ORAL | 28 days supply | Qty: 56 | Fill #4

## 2022-07-01 MED ORDER — CIPROFLOXACIN 500 MG TABLET
ORAL_TABLET | Freq: Two times a day (BID) | ORAL | 0 refills | 5 days | Status: CP
Start: 2022-07-01 — End: 2022-07-06

## 2022-07-02 ENCOUNTER — Ambulatory Visit: Admit: 2022-07-02 | Discharge: 2022-07-03 | Payer: PRIVATE HEALTH INSURANCE

## 2022-07-02 DIAGNOSIS — G10 Huntington's disease: Principal | ICD-10-CM

## 2022-07-02 MED ORDER — MIRABEGRON ER 25 MG TABLET,EXTENDED RELEASE 24 HR
ORAL_TABLET | Freq: Every day | ORAL | 3 refills | 30 days | Status: CP
Start: 2022-07-02 — End: ?

## 2022-07-04 DIAGNOSIS — G10 Huntington's disease: Principal | ICD-10-CM

## 2022-07-04 DIAGNOSIS — R350 Frequency of micturition: Principal | ICD-10-CM

## 2022-07-04 MED ORDER — CIPROFLOXACIN 500 MG TABLET
ORAL_TABLET | Freq: Two times a day (BID) | ORAL | 0 refills | 5 days | Status: CP
Start: 2022-07-04 — End: 2022-07-09

## 2022-07-08 ENCOUNTER — Ambulatory Visit: Admit: 2022-07-08 | Payer: PRIVATE HEALTH INSURANCE

## 2022-07-10 DIAGNOSIS — R3915 Urgency of urination: Principal | ICD-10-CM

## 2022-07-10 DIAGNOSIS — G10 Huntington's disease: Principal | ICD-10-CM

## 2022-07-26 ENCOUNTER — Ambulatory Visit
Admit: 2022-07-26 | Discharge: 2022-07-27 | Payer: MEDICAID | Attending: Student in an Organized Health Care Education/Training Program | Primary: Student in an Organized Health Care Education/Training Program

## 2022-07-26 DIAGNOSIS — I10 Essential (primary) hypertension: Principal | ICD-10-CM

## 2022-07-26 DIAGNOSIS — K219 Gastro-esophageal reflux disease without esophagitis: Principal | ICD-10-CM

## 2022-07-26 DIAGNOSIS — M1711 Unilateral primary osteoarthritis, right knee: Principal | ICD-10-CM

## 2022-07-26 DIAGNOSIS — R350 Frequency of micturition: Principal | ICD-10-CM

## 2022-08-05 MED ORDER — OLANZAPINE 10 MG TABLET
ORAL_TABLET | Freq: Every evening | ORAL | 2 refills | 30 days | Status: CP
Start: 2022-08-05 — End: 2022-11-03

## 2022-08-12 ENCOUNTER — Ambulatory Visit
Admit: 2022-08-12 | Payer: MEDICARE | Attending: Student in an Organized Health Care Education/Training Program | Primary: Student in an Organized Health Care Education/Training Program

## 2022-08-23 ENCOUNTER — Ambulatory Visit: Admit: 2022-08-23 | Discharge: 2022-08-24 | Payer: MEDICARE

## 2022-09-03 ENCOUNTER — Other Ambulatory Visit: Payer: Self-pay

## 2022-09-03 ENCOUNTER — Emergency Department (HOSPITAL_COMMUNITY)
Admission: EM | Admit: 2022-09-03 | Discharge: 2022-09-04 | Disposition: A | Discharge status: Home / Self Care | Payer: 59 | Attending: Emergency Medicine | Admitting: Emergency Medicine

## 2022-09-03 ENCOUNTER — Encounter (HOSPITAL_COMMUNITY): Payer: Self-pay | Admitting: Emergency Medicine

## 2022-09-03 DIAGNOSIS — R112 Nausea with vomiting, unspecified: Secondary | ICD-10-CM

## 2022-09-03 DIAGNOSIS — F10129 Alcohol abuse with intoxication, unspecified: Secondary | ICD-10-CM | POA: Insufficient documentation

## 2022-09-03 DIAGNOSIS — N390 Urinary tract infection, site not specified: Secondary | ICD-10-CM | POA: Diagnosis not present

## 2022-09-03 DIAGNOSIS — Z5900 Homelessness unspecified: Secondary | ICD-10-CM | POA: Insufficient documentation

## 2022-09-03 DIAGNOSIS — F10920 Alcohol use, unspecified with intoxication, uncomplicated: Secondary | ICD-10-CM

## 2022-09-03 DIAGNOSIS — R748 Abnormal levels of other serum enzymes: Secondary | ICD-10-CM | POA: Diagnosis not present

## 2022-09-03 MED ORDER — ONDANSETRON HCL 4 MG/2ML IJ SOLN
4.0000 mg | Freq: Once | INTRAMUSCULAR | Status: AC
  Administered 2022-09-04: 4 mg via INTRAVENOUS
  Filled 2022-09-03: qty 2

## 2022-09-03 MED ORDER — SODIUM CHLORIDE 0.9 % IV BOLUS
1000.0000 mL | Freq: Once | INTRAVENOUS | Status: AC
  Administered 2022-09-04: 1000 mL via INTRAVENOUS

## 2022-09-03 NOTE — ED Triage Notes (Addendum)
Patient BIB EMS c/o N/V. Pt stated he have not taken his medication for 5 days. Pt stated his been drinking alcohol today. Pt a/ox4. Pt homeless. BP124/60 HR 74 RR 20 O2sat 100% on RA

## 2022-09-03 NOTE — ED Provider Notes (Signed)
[place] EMERGENCY DEPARTMENT AT [facility] Provider Note   CSN: [id number] Arrival date & time: 09/03/22  2143     History {Add pertinent medical, surgical, social history, OB history to HPI:1} Chief Complaint  Patient presents with   Nausea   Emesis    Albert Ellis is a 67 y.o. male.  The history is provided by the patient.  Emesis He has history of seizures and comes in complaining of right knee pain because he has not had his medications.  He states that his daughter threw all of his medicines out about a month ago, so he has not had anything to take.  He also relates that he has been vomiting for the last 2 days several times a day.  He is complaining of some mild periumbilical pain.  He denies diarrhea.  He does admit to drinking 80 ounces of beer today, denies other drug use.  He does not know what his medications are.  He is homeless.   Home Medications Prior to Admission medications   Medication Sig Start Date End Date Taking? Authorizing Provider  atorvastatin (LIPITOR) 20 MG tablet Take 20 mg by mouth daily at 6 PM.    [provider]  calcium carbonate (TUMS - DOSED IN MG ELEMENTAL CALCIUM) 500 MG chewable tablet Chew 1 tablet by mouth 2 (two) times daily with a meal.    [provider]  divalproex (DEPAKOTE) 500 MG DR tablet Take 500 mg by mouth 2 (two) times daily.    [provider]  hydrochlorothiazide (HYDRODIURIL) 25 MG tablet Take 25 mg by mouth daily.    [provider]  mirtazapine (REMERON) 7.5 MG tablet Take 7.5 mg by mouth at bedtime.    [provider]  OLANZapine (ZYPREXA) 15 MG tablet Take 30 mg by mouth at bedtime.    [provider]      Allergies    Patient has no known allergies.    Review of Systems   Review of Systems  Gastrointestinal:  Positive for vomiting.  All other systems reviewed and are negative.   Physical Exam Updated Vital Signs BP 122/83 (BP Location: Right  Arm)   Pulse 74   Temp 97.8 F (36.6 C) (Oral)   Resp (!) 27   SpO2 98%  Physical Exam Vitals and nursing note reviewed.   67 year old male, resting comfortably and in no acute distress. Vital signs are significant for elevated respiratory rate. Oxygen saturation is 98%, which is normal. Head is normocephalic and atraumatic. PERRLA, EOMI. Oropharynx is clear. Neck is nontender and supple without adenopathy or JVD. Back is nontender and there is no CVA tenderness. Lungs are clear without rales, wheezes, or rhonchi. Chest is nontender. Heart has regular rate and rhythm without murmur. Abdomen is soft, flat, nontender.  Peristalsis is hypoactive. Extremities have no cyanosis or edema, full range of motion is present. Skin is warm and dry without rash. Neurologic: Mental status is normal, cranial nerves are intact, moves all extremities equally.  ED Results / Procedures / Treatments   Labs (all labs ordered are listed, but only abnormal results are displayed) Labs Reviewed - No data to display  EKG EKG Interpretation  Date/Time:  Tuesday September 03 2022 22:14:37 EDT Ventricular Rate:  72 PR Interval:  157 QRS Duration: 86 QT Interval:  396 QTC Calculation: 434 R Axis:   3 Text Interpretation: Sinus rhythm Probable left atrial enlargement Abnormal R-wave progression, early transition Borderline ST elevation, anterolateral  leads When compared with ECG of EARLIER SAME DATE No significant change was found Confirmed by Dione Booze ([id number]) on 09/03/2022 11:10:07 PM   EKG Interpretation  Date/Time:  Tuesday September 03 2022 22:14:37 EDT Ventricular Rate:  72 PR Interval:  157 QRS Duration: 86 QT Interval:  396 QTC Calculation: 434 R Axis:   3 Text Interpretation: Sinus rhythm Probable left atrial enlargement Abnormal R-wave progression, early transition Borderline ST elevation, anterolateral leads When compared with ECG of EARLIER SAME DATE No significant change was found Confirmed by  Dione Booze ([id number]) on 09/03/2022 11:10:07 PM        Radiology No results found.  Procedures Procedures  {Document cardiac monitor, telemetry assessment procedure when appropriate:1}  Medications Ordered in ED Medications - No data to display  ED Course/ Medical Decision Making/ A&P   {   Click here for ABCD2, HEART and other calculatorsREFRESH Note before signing :1}                          Medical Decision Making Amount and/or Complexity of Data Reviewed Labs: ordered.  Risk Prescription drug management.   Chronic right knee pain.  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  Doubt serious pathology such as bowel obstruction, diverticulitis.  Consider mild pancreatitis.  Probable alcohol intoxication.  Unfortunately, patient does not know what his medications are and he has no records in our system any more recently than 2020.  I have ordered IV fluids, ondansetron and I have ordered laboratory workup of CBC, competence of metabolic panel, lipase, ethanol level, urinalysis.  I am attempting to find his medication list from his pharmacy.  I have been unable to get his medication information from his pharmacy.  I reviewed and interpreted his electrocardiogram, and my interpretation is borderline ST elevation which was not felt to be clinically significant, prior T wave abnormality has improved.  I have reviewed and interpreted a repeat ECG, and my interpretation is unchanged from first ECG.  {Document critical care time when appropriate:1} {Document review of labs and clinical decision tools ie heart score, Chads2Vasc2 etc:1}  {Document your independent review of radiology images, and any outside records:1} {Document your discussion with family members, caretakers, and with consultants:1} Patient is homeless. {Document your decision making why or why not admission, treatments were needed:1} Final Clinical Impression(s) / ED  Diagnoses Final diagnoses:  None    Rx / DC Orders ED Discharge Orders     None

## 2022-09-04 DIAGNOSIS — F10129 Alcohol abuse with intoxication, unspecified: Secondary | ICD-10-CM | POA: Diagnosis not present

## 2022-09-04 LAB — CBC WITH DIFFERENTIAL/PLATELET
Abs Immature Granulocytes: 0.03 10*3/uL (ref 0.00–0.07)
Basophils Absolute: 0 10*3/uL (ref 0.0–0.1)
Basophils Relative: 0 %
Eosinophils Absolute: 0.1 10*3/uL (ref 0.0–0.5)
Eosinophils Relative: 1 %
HCT: 41.1 % (ref 39.0–52.0)
Hemoglobin: 13 g/dL (ref 13.0–17.0)
Immature Granulocytes: 0 %
Lymphocytes Relative: 39 %
MCH: 22.9 pg — ABNORMAL LOW (ref 26.0–34.0)
MCHC: 31.6 g/dL (ref 30.0–36.0)
MCV: 72.4 fL — ABNORMAL LOW (ref 80.0–100.0)
Monocytes Absolute: 0.8 10*3/uL (ref 0.1–1.0)
Monocytes Relative: 10 %
Neutro Abs: 3.5 10*3/uL (ref 1.7–7.7)
Neutrophils Relative %: 50 %
Platelets: 262 10*3/uL (ref 150–400)
RBC: 5.68 MIL/uL (ref 4.22–5.81)
RDW: 16.3 % — ABNORMAL HIGH (ref 11.5–15.5)
WBC: 7.3 10*3/uL (ref 4.0–10.5)
nRBC: 0 % (ref 0.0–0.2)

## 2022-09-04 LAB — URINALYSIS, ROUTINE W REFLEX MICROSCOPIC
Bilirubin Urine: NEGATIVE
Glucose, UA: NEGATIVE mg/dL
Ketones, ur: NEGATIVE mg/dL
Nitrite: POSITIVE — AB
Protein, ur: NEGATIVE mg/dL
Specific Gravity, Urine: 1.01 (ref 1.005–1.030)
WBC, UA: >50 WBC/hpf (ref 0–5)
pH: 5 (ref 5.0–8.0)

## 2022-09-04 LAB — LIPASE, BLOOD: Lipase: 76 U/L — ABNORMAL HIGH (ref 11–51)

## 2022-09-04 LAB — COMPREHENSIVE METABOLIC PANEL
ALT: 27 U/L (ref 0–44)
AST: 37 U/L (ref 15–41)
Albumin: 3.6 g/dL (ref 3.5–5.0)
Alkaline Phosphatase: 72 U/L (ref 38–126)
Anion gap: 10 (ref 5–15)
BUN: 19 mg/dL (ref 8–23)
CO2: 24 mmol/L (ref 22–32)
Calcium: 8.9 mg/dL (ref 8.9–10.3)
Chloride: 103 mmol/L (ref 98–111)
Creatinine, Ser: 1.02 mg/dL (ref 0.61–1.24)
GFR, Estimated: >60 mL/min (ref >60)
Glucose, Bld: 80 mg/dL (ref 70–99)
Potassium: 4.3 mmol/L (ref 3.5–5.1)
Sodium: 137 mmol/L (ref 135–145)
Total Bilirubin: 0.3 mg/dL (ref 0.3–1.2)
Total Protein: 7.9 g/dL (ref 6.5–8.1)

## 2022-09-04 LAB — ETHANOL: Alcohol, Ethyl (B): 159 mg/dL — ABNORMAL HIGH (ref <10)

## 2022-09-04 MED ORDER — METOCLOPRAMIDE HCL 10 MG PO TABS: 10.0000 mg | ORAL_TABLET | Freq: Four times a day (QID) | ORAL | 0 refills | Status: DC

## 2022-09-04 MED ORDER — CEPHALEXIN 500 MG PO CAPS
500.0000 mg | ORAL_CAPSULE | Freq: Once | ORAL | Status: AC
  Administered 2022-09-04: 500 mg via ORAL
  Filled 2022-09-04: qty 1

## 2022-09-04 MED ORDER — METOCLOPRAMIDE HCL 5 MG/ML IJ SOLN
10.0000 mg | Freq: Once | INTRAMUSCULAR | Status: AC
  Administered 2022-09-04: 10 mg via INTRAVENOUS

## 2022-09-04 MED ORDER — CEPHALEXIN 500 MG PO CAPS: 500.0000 mg | ORAL_CAPSULE | Freq: Three times a day (TID) | ORAL | 0 refills | Status: DC

## 2022-09-04 MED ORDER — METOCLOPRAMIDE HCL 5 MG/ML IJ SOLN
INTRAMUSCULAR | Status: AC
  Filled 2022-09-04: qty 2

## 2022-09-04 NOTE — ED Notes (Signed)
RN aware of pt's vitals  

## 2022-09-04 NOTE — Discharge Instructions (Addendum)
Please contact your primary care provider to arrange new prescriptions for your chronic medications.

## 2022-09-04 NOTE — ED Notes (Signed)
Patient provided with dry clean clothes, sandwich and applejuice.

## 2022-10-04 ENCOUNTER — Ambulatory Visit: Admit: 2022-10-04 | Payer: MEDICAID

## 2022-10-11 DIAGNOSIS — B182 Chronic viral hepatitis C: Principal | ICD-10-CM

## 2023-01-16 NOTE — Unmapped (Signed)
Specialty Medication(s): tetrabenazine    Mark Chaney has been dis-enrolled from the [facility] specialty pharmacy services due to multiple unsuccessful outreach attempts by the pharmacy.    Additional information provided to the patient: n/a    Arnold Long, PharmD  [facility] Specialty Pharmacist

## 2023-04-29 ENCOUNTER — Emergency Department (HOSPITAL_COMMUNITY): Payer: 59

## 2023-04-29 ENCOUNTER — Emergency Department (HOSPITAL_COMMUNITY)
Admission: EM | Admit: 2023-04-29 | Discharge: 2023-04-30 | Disposition: A | Discharge status: Home / Self Care | Payer: 59 | Attending: Emergency Medicine | Admitting: Emergency Medicine

## 2023-04-29 DIAGNOSIS — M25561 Pain in right knee: Secondary | ICD-10-CM | POA: Diagnosis not present

## 2023-04-29 DIAGNOSIS — Y902 Blood alcohol level of 40-59 mg/100 ml: Secondary | ICD-10-CM | POA: Diagnosis not present

## 2023-04-29 DIAGNOSIS — R0602 Shortness of breath: Secondary | ICD-10-CM | POA: Insufficient documentation

## 2023-04-29 DIAGNOSIS — Z87891 Personal history of nicotine dependence: Secondary | ICD-10-CM | POA: Insufficient documentation

## 2023-04-29 DIAGNOSIS — Z5900 Homelessness unspecified: Secondary | ICD-10-CM | POA: Diagnosis not present

## 2023-04-29 DIAGNOSIS — F1090 Alcohol use, unspecified, uncomplicated: Secondary | ICD-10-CM | POA: Insufficient documentation

## 2023-04-29 DIAGNOSIS — G8929 Other chronic pain: Secondary | ICD-10-CM | POA: Insufficient documentation

## 2023-04-29 DIAGNOSIS — R0600 Dyspnea, unspecified: Secondary | ICD-10-CM

## 2023-04-29 LAB — COMPREHENSIVE METABOLIC PANEL
ALT: 27 U/L (ref 0–44)
AST: 40 U/L (ref 15–41)
Albumin: 3.4 g/dL — ABNORMAL LOW (ref 3.5–5.0)
Alkaline Phosphatase: 71 U/L (ref 38–126)
Anion gap: 11 (ref 5–15)
BUN: 19 mg/dL (ref 8–23)
CO2: 22 mmol/L (ref 22–32)
Calcium: 9.3 mg/dL (ref 8.9–10.3)
Chloride: 103 mmol/L (ref 98–111)
Creatinine, Ser: 0.87 mg/dL (ref 0.61–1.24)
GFR, Estimated: >60 mL/min (ref >60)
Glucose, Bld: 124 mg/dL — ABNORMAL HIGH (ref 70–99)
Potassium: 3.8 mmol/L (ref 3.5–5.1)
Sodium: 136 mmol/L (ref 135–145)
Total Bilirubin: 0.4 mg/dL (ref <1.2)
Total Protein: 7.3 g/dL (ref 6.5–8.1)

## 2023-04-29 LAB — ETHANOL: Alcohol, Ethyl (B): 56 mg/dL — ABNORMAL HIGH (ref <10)

## 2023-04-29 LAB — CBC
HCT: 37.5 % — ABNORMAL LOW (ref 39.0–52.0)
Hemoglobin: 12.1 g/dL — ABNORMAL LOW (ref 13.0–17.0)
MCH: 23.2 pg — ABNORMAL LOW (ref 26.0–34.0)
MCHC: 32.3 g/dL (ref 30.0–36.0)
MCV: 72 fL — ABNORMAL LOW (ref 80.0–100.0)
Platelets: 322 10*3/uL (ref 150–400)
RBC: 5.21 MIL/uL (ref 4.22–5.81)
RDW: 15.5 % (ref 11.5–15.5)
WBC: 6.7 10*3/uL (ref 4.0–10.5)
nRBC: 0 % (ref 0.0–0.2)

## 2023-04-29 LAB — RAPID URINE DRUG SCREEN, HOSP PERFORMED
Amphetamines: NOT DETECTED
Barbiturates: NOT DETECTED
Benzodiazepines: NOT DETECTED
Cocaine: NOT DETECTED
Opiates: NOT DETECTED
Tetrahydrocannabinol: NOT DETECTED

## 2023-04-29 NOTE — ED Provider Notes (Signed)
[place] EMERGENCY DEPARTMENT AT [facility] Provider Note   CSN: [id number] Arrival date & time: 04/29/23  2013     History {Add pertinent medical, surgical, social history, OB history to HPI:1} Chief Complaint  Patient presents with   Alcohol Intoxication    Albert Ellis is a 67 y.o. male.  He has PMH of alcohol abuse, cocaine abuse, hepatitis C, homelessness.  Presents the ER today via EMS complaining of shortness of breath, states has been going on for about 3 days.  Denies cough or fevers, he is a former smoker.  Denies any chest pain or exertional symptoms.  He states symptoms are constant.  Also complaining of right knee pain, states he needs an x-ray, reports it is "bone-on-bone".  Does report drinking liquor today as well.  Patient noted to be wheelchair-bound at baseline, was picked up from [employer], does not present with his wheelchair today in the ED   Alcohol Intoxication       Home Medications Prior to Admission medications   Medication Sig Start Date End Date Taking? Authorizing Provider  atorvastatin (LIPITOR) 20 MG tablet Take 20 mg by mouth daily at 6 PM.    [provider]  calcium carbonate (TUMS - DOSED IN MG ELEMENTAL CALCIUM) 500 MG chewable tablet Chew 1 tablet by mouth 2 (two) times daily with a meal.    [provider]  cephALEXin (KEFLEX) 500 MG capsule Take 1 capsule (500 mg total) by mouth 3 (three) times daily. 09/04/22   Dione Booze, MD  divalproex (DEPAKOTE) 500 MG DR tablet Take 500 mg by mouth 2 (two) times daily.    [provider]  hydrochlorothiazide (HYDRODIURIL) 25 MG tablet Take 25 mg by mouth daily.    [provider]  metoCLOPramide (REGLAN) 10 MG tablet Take 1 tablet (10 mg total) by mouth every 6 (six) hours. 09/04/22   Dione Booze, MD  mirtazapine (REMERON) 7.5 MG tablet Take 7.5 mg by mouth at bedtime.    [provider]  OLANZapine (ZYPREXA) 15 MG tablet Take 30 mg by mouth at  bedtime.    [provider]      Allergies    Patient has no known allergies.    Review of Systems   Review of Systems  Physical Exam Updated Vital Signs BP (!) 130/92 (BP Location: Right Arm)   Pulse 100   Temp 98.1 F (36.7 C) (Oral)   Resp 18   SpO2 95%  Physical Exam Vitals and nursing note reviewed.  Constitutional:      General: He is not in acute distress.    Appearance: He is well-developed.  HENT:     Head: Normocephalic and atraumatic.     Mouth/Throat:     Mouth: Mucous membranes are moist.  Eyes:     Conjunctiva/sclera: Conjunctivae normal.  Cardiovascular:     Rate and Rhythm: Normal rate and regular rhythm.     Heart sounds: No murmur heard. Pulmonary:     Effort: Pulmonary effort is normal. No respiratory distress.     Breath sounds: Normal breath sounds.  Abdominal:     Palpations: Abdomen is soft.     Tenderness: There is no abdominal tenderness.  Musculoskeletal:        General: No swelling.     Cervical back: Neck supple.     Comments: Right knee is not tender or erythematous, distal pulses intact, patient holds knee flexed at about 90 degrees, able to extend it.  Skin:    General: Skin is warm and dry.     Capillary Refill: Capillary refill takes less than 2 seconds.  Neurological:     General: No focal deficit present.     Mental Status: He is alert and oriented to person, place, and time.  Psychiatric:        Mood and Affect: Mood normal.     Comments: Patient has somewhat rapid speech, no SI or HI, no paranoia or delusions noted     ED Results / Procedures / Treatments   Labs (all labs ordered are listed, but only abnormal results are displayed) Labs Reviewed  COMPREHENSIVE METABOLIC PANEL - Abnormal; Notable for the following components:      Result Value   Glucose, Bld 124 (*)    Albumin 3.4 (*)    All other components within normal limits  ETHANOL - Abnormal; Notable for the following components:   Alcohol, Ethyl (B) 56  (*)    All other components within normal limits  CBC - Abnormal; Notable for the following components:   Hemoglobin 12.1 (*)    HCT 37.5 (*)    MCV 72.0 (*)    MCH 23.2 (*)    All other components within normal limits  RAPID URINE DRUG SCREEN, HOSP PERFORMED    EKG None  Radiology No results found.  Procedures Procedures  {Document cardiac monitor, telemetry assessment procedure when appropriate:1}  Medications Ordered in ED Medications - No data to display  ED Course/ Medical Decision Making/ A&P   {   Click here for ABCD2, HEART and other calculatorsREFRESH Note before signing :1}                              Medical Decision Making Amount and/or Complexity of Data Reviewed Labs: ordered. Radiology: ordered.   ***  {Document critical care time when appropriate:1} {Document review of labs and clinical decision tools ie heart score, Chads2Vasc2 etc:1}  {Document your independent review of radiology images, and any outside records:1} {Document your discussion with family members, caretakers, and with consultants:1} {Document social determinants of health affecting pt's care:1} {Document your decision making why or why not admission, treatments were needed:1} Final Clinical Impression(s) / ED Diagnoses Final diagnoses:  None    Rx / DC Orders ED Discharge Orders     None

## 2023-04-29 NOTE — ED Triage Notes (Signed)
Pt to ED via [facility] from [employer]. Pt is homeless and wheelchair bound baseline. Pt's wheelchair left at the [facility] on [place], [place] [address]. Pt called EMS for SOB. Pt has been restless and erratic with EMS. Pt reports drinking half a gallon of liquor about an hour and a half PTA. Pt no longer c/o SOB upon arrival to ED. Pt c/o chronic back and knee pain. Pt denies SI /HI.    EMS: 132/86 90 HR 98% RA 118 CBG

## 2023-04-29 NOTE — ED Notes (Addendum)
Patient assured safety and provided comfort with some warm blankets.

## 2023-04-30 ENCOUNTER — Emergency Department (HOSPITAL_COMMUNITY): Payer: 59

## 2023-04-30 DIAGNOSIS — R0602 Shortness of breath: Secondary | ICD-10-CM | POA: Diagnosis not present

## 2023-04-30 LAB — TROPONIN I (HIGH SENSITIVITY)
Troponin I (High Sensitivity): 27 ng/L — ABNORMAL HIGH (ref <18)
Troponin I (High Sensitivity): 26 ng/L — ABNORMAL HIGH (ref <18)

## 2023-04-30 MED ORDER — DICLOFENAC SODIUM 1 % EX GEL: 4.0000 g | Freq: Four times a day (QID) | CUTANEOUS | Status: DC

## 2023-04-30 MED ORDER — LIDOCAINE 5 % EX PTCH
1.0000 | MEDICATED_PATCH | CUTANEOUS | Status: DC
  Administered 2023-04-30: 1 via TRANSDERMAL
  Filled 2023-04-30: qty 1

## 2023-04-30 MED ORDER — IOHEXOL 350 MG/ML SOLN
60.0000 mL | Freq: Once | INTRAVENOUS | Status: AC | PRN
  Administered 2023-04-30: 60 mL via INTRAVENOUS

## 2023-04-30 NOTE — Discharge Instructions (Addendum)
Was a pleasure taking care of you today.  You were seen for chronic pain, shortness of breath.  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  Follow-up with orthopedics.  You are given diclofenac gel to help with the pain.  Your cardiac workup was reassuring.  We did a CT scan of your chest, there is no blood clot in your lung, no pneumonia, no collapsed lung, no fluid in your lungs to suggest a cause of your shortness of breath, your symptoms may be due to a virus.  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

## 2023-05-07 ENCOUNTER — Encounter (HOSPITAL_COMMUNITY): Payer: Self-pay

## 2023-05-07 ENCOUNTER — Emergency Department (HOSPITAL_COMMUNITY)
Admission: EM | Admit: 2023-05-07 | Discharge: 2023-05-07 | Disposition: A | Discharge status: Home / Self Care | Payer: 59 | Attending: Emergency Medicine | Admitting: Emergency Medicine

## 2023-05-07 ENCOUNTER — Emergency Department (HOSPITAL_COMMUNITY): Payer: 59

## 2023-05-07 ENCOUNTER — Other Ambulatory Visit: Payer: Self-pay

## 2023-05-07 DIAGNOSIS — Y900 Blood alcohol level of less than 20 mg/100 ml: Secondary | ICD-10-CM | POA: Insufficient documentation

## 2023-05-07 DIAGNOSIS — R1013 Epigastric pain: Secondary | ICD-10-CM | POA: Diagnosis present

## 2023-05-07 DIAGNOSIS — R072 Precordial pain: Secondary | ICD-10-CM | POA: Insufficient documentation

## 2023-05-07 LAB — CBC
HCT: 34.7 % — ABNORMAL LOW (ref 39.0–52.0)
Hemoglobin: 11.6 g/dL — ABNORMAL LOW (ref 13.0–17.0)
MCH: 24 pg — ABNORMAL LOW (ref 26.0–34.0)
MCHC: 33.4 g/dL (ref 30.0–36.0)
MCV: 71.8 fL — ABNORMAL LOW (ref 80.0–100.0)
Platelets: 344 10*3/uL (ref 150–400)
RBC: 4.83 MIL/uL (ref 4.22–5.81)
RDW: 14.7 % (ref 11.5–15.5)
WBC: 6.9 10*3/uL (ref 4.0–10.5)
nRBC: 0 % (ref 0.0–0.2)

## 2023-05-07 LAB — COMPREHENSIVE METABOLIC PANEL
ALT: 39 U/L (ref 0–44)
AST: 59 U/L — ABNORMAL HIGH (ref 15–41)
Albumin: 3.2 g/dL — ABNORMAL LOW (ref 3.5–5.0)
Alkaline Phosphatase: 58 U/L (ref 38–126)
Anion gap: 7 (ref 5–15)
BUN: 30 mg/dL — ABNORMAL HIGH (ref 8–23)
CO2: 26 mmol/L (ref 22–32)
Calcium: 8.5 mg/dL — ABNORMAL LOW (ref 8.9–10.3)
Chloride: 98 mmol/L (ref 98–111)
Creatinine, Ser: 0.94 mg/dL (ref 0.61–1.24)
GFR, Estimated: >60 mL/min (ref >60)
Glucose, Bld: 90 mg/dL (ref 70–99)
Potassium: 3.3 mmol/L — ABNORMAL LOW (ref 3.5–5.1)
Sodium: 131 mmol/L — ABNORMAL LOW (ref 135–145)
Total Bilirubin: 0.4 mg/dL (ref <1.2)
Total Protein: 6.9 g/dL (ref 6.5–8.1)

## 2023-05-07 LAB — MAGNESIUM: Magnesium: 2 mg/dL (ref 1.7–2.4)

## 2023-05-07 LAB — ETHANOL: Alcohol, Ethyl (B): <10 mg/dL (ref <10)

## 2023-05-07 LAB — TROPONIN I (HIGH SENSITIVITY): Troponin I (High Sensitivity): 10 ng/L (ref <18)

## 2023-05-07 LAB — LIPASE, BLOOD: Lipase: 28 U/L (ref 11–51)

## 2023-05-07 MED ORDER — ALUM & MAG HYDROXIDE-SIMETH 200-200-20 MG/5ML PO SUSP
30.0000 mL | Freq: Once | ORAL | Status: AC
  Administered 2023-05-07: 30 mL via ORAL
  Filled 2023-05-07: qty 30

## 2023-05-07 MED ORDER — HYDROMORPHONE HCL 1 MG/ML IJ SOLN: 0.5000 mg | Freq: Once | INTRAMUSCULAR | Status: DC

## 2023-05-07 MED ORDER — FAMOTIDINE IN NACL 20-0.9 MG/50ML-% IV SOLN
20.0000 mg | Freq: Once | INTRAVENOUS | Status: AC
  Administered 2023-05-07: 20 mg via INTRAVENOUS
  Filled 2023-05-07: qty 50

## 2023-05-07 MED ORDER — FAMOTIDINE 20 MG PO TABS: 20.0000 mg | ORAL_TABLET | Freq: Two times a day (BID) | ORAL | 0 refills | Status: DC

## 2023-05-07 MED ORDER — OXYCODONE-ACETAMINOPHEN 5-325 MG PO TABS
1.0000 | ORAL_TABLET | Freq: Once | ORAL | Status: AC
  Administered 2023-05-07: 1 via ORAL
  Filled 2023-05-07: qty 1

## 2023-05-07 MED ORDER — ONDANSETRON HCL 4 MG PO TABS: 4.0000 mg | ORAL_TABLET | Freq: Three times a day (TID) | ORAL | 0 refills | Status: AC | PRN

## 2023-05-07 MED ORDER — ONDANSETRON HCL 4 MG/2ML IJ SOLN
4.0000 mg | Freq: Once | INTRAMUSCULAR | Status: AC
  Administered 2023-05-07: 4 mg via INTRAVENOUS
  Filled 2023-05-07: qty 2

## 2023-05-07 MED ORDER — KETOROLAC TROMETHAMINE 30 MG/ML IJ SOLN
30.0000 mg | Freq: Once | INTRAMUSCULAR | Status: AC
  Administered 2023-05-07: 30 mg via INTRAVENOUS
  Filled 2023-05-07: qty 1

## 2023-05-07 NOTE — Discharge Instructions (Addendum)
Your blood test and CT scan did not show any medical emergencies.  You may be having irritation of your stomach called gastritis.  This can happen from drinking alcohol.  I strongly encourage you to cut back and stop drinking alcohol.

## 2023-05-07 NOTE — ED Provider Notes (Signed)
[place] EMERGENCY DEPARTMENT AT [facility] Provider Note   CSN: [id number] Arrival date & time: 05/07/23  1811     History  Chief Complaint  Patient presents with   Abdominal Pain    Albert Ellis is a 67 y.o. male presenting to ED with epigastric pain.  Patient is a poor historian and difficult with compliance in the room.  Reports substernal chest pressure and pain for about 3 weeks, reports daily ETOH consumption, says "everything hurts, my body just hurts man."  HPI     Home Medications Prior to Admission medications   Medication Sig Start Date End Date Taking? Authorizing Provider  famotidine (PEPCID) 20 MG tablet Take 1 tablet (20 mg total) by mouth 2 (two) times daily. 05/07/23 06/06/23 Yes Mollyann Halbert, Kermit Balo, MD  ondansetron (ZOFRAN) 4 MG tablet Take 1 tablet (4 mg total) by mouth every 8 (eight) hours as needed for up to 12 doses for nausea or vomiting. 05/07/23  Yes Shamarra Warda, Kermit Balo, MD  atorvastatin (LIPITOR) 20 MG tablet Take 20 mg by mouth daily at 6 PM.    [provider]  calcium carbonate (TUMS - DOSED IN MG ELEMENTAL CALCIUM) 500 MG chewable tablet Chew 1 tablet by mouth 2 (two) times daily with a meal.    [provider]  cephALEXin (KEFLEX) 500 MG capsule Take 1 capsule (500 mg total) by mouth 3 (three) times daily. 09/04/22   Dione Booze, MD  diclofenac Sodium (VOLTAREN) 1 % GEL Apply 4 g topically 4 (four) times daily. 04/30/23   Carmel Sacramento A, PA-C  divalproex (DEPAKOTE) 500 MG DR tablet Take 500 mg by mouth 2 (two) times daily.    [provider]  hydrochlorothiazide (HYDRODIURIL) 25 MG tablet Take 25 mg by mouth daily.    [provider]  metoCLOPramide (REGLAN) 10 MG tablet Take 1 tablet (10 mg total) by mouth every 6 (six) hours. 09/04/22   Dione Booze, MD  mirtazapine (REMERON) 7.5 MG tablet Take 7.5 mg by mouth at bedtime.    [provider]  OLANZapine (ZYPREXA) 15 MG tablet Take 30 mg by  mouth at bedtime.    [provider]      Allergies    Patient has no known allergies.    Review of Systems   Review of Systems  Physical Exam Updated Vital Signs BP 117/74 (BP Location: Left Arm)   Pulse 82   Temp 98.8 F (37.1 C) (Oral)   Resp 17   Ht 5\' 9"  (1.753 m)   Wt 83.9 kg   SpO2 98%   BMI 27.32 kg/m  Physical Exam Constitutional:      General: He is not in acute distress. HENT:     Head: Normocephalic and atraumatic.  Eyes:     Conjunctiva/sclera: Conjunctivae normal.     Pupils: Pupils are equal, round, and reactive to light.  Cardiovascular:     Rate and Rhythm: Normal rate and regular rhythm.  Pulmonary:     Effort: Pulmonary effort is normal. No respiratory distress.  Abdominal:     General: There is no distension.     Tenderness: There is no abdominal tenderness.  Skin:    General: Skin is warm and dry.  Neurological:     General: No focal deficit present.     Mental Status: He is alert. Mental status is at baseline.  Psychiatric:        Mood and Affect: Mood normal.  Behavior: Behavior normal.     ED Results / Procedures / Treatments   Labs (all labs ordered are listed, but only abnormal results are displayed) Labs Reviewed  CBC - Abnormal; Notable for the following components:      Result Value   Hemoglobin 11.6 (*)    HCT 34.7 (*)    MCV 71.8 (*)    MCH 24.0 (*)    All other components within normal limits  COMPREHENSIVE METABOLIC PANEL - Abnormal; Notable for the following components:   Sodium 131 (*)    Potassium 3.3 (*)    BUN 30 (*)    Calcium 8.5 (*)    Albumin 3.2 (*)    AST 59 (*)    All other components within normal limits  RESP PANEL BY RT-PCR (RSV, FLU A&B, COVID)  RVPGX2  LIPASE, BLOOD  ETHANOL  MAGNESIUM  TROPONIN I (HIGH SENSITIVITY)    EKG EKG Interpretation Date/Time:  Wednesday May 07 2023 20:17:12 EST Ventricular Rate:  100 PR Interval:  144 QRS Duration:  71 QT Interval:  353 QTC  Calculation: 456 R Axis:   31  Text Interpretation: Sinus tachycardia Borderline T wave abnormalities Confirmed by Alvester Chou [phone]) on 05/07/2023 8:22:57 PM  Radiology DG Chest 2 View  Result Date: 05/07/2023 CLINICAL DATA:  Chest pain EXAM: CHEST - 2 VIEW COMPARISON:  CT chest dated 04/30/2023 FINDINGS: Lungs are clear.  No pleural effusion or pneumothorax. The heart is normal in size. Visualized osseous structures are within normal limits. IMPRESSION: Normal chest radiographs. Electronically Signed   By: Charline Bills M.D.   On: 05/07/2023 19:56    Procedures Procedures    Medications Ordered in ED Medications  HYDROmorphone (DILAUDID) injection 0.5 mg (has no administration in time range)  famotidine (PEPCID) IVPB 20 mg premix (20 mg Intravenous New Bag/Given 05/07/23 2120)  ketorolac (TORADOL) 30 MG/ML injection 30 mg (30 mg Intravenous Given 05/07/23 2119)  ondansetron (ZOFRAN) injection 4 mg (4 mg Intravenous Given 05/07/23 2119)  alum & mag hydroxide-simeth (MAALOX/MYLANTA) 200-200-20 MG/5ML suspension 30 mL (30 mLs Oral Given 05/07/23 2119)    ED Course/ Medical Decision Making/ A&P Clinical Course as of 05/07/23 2308  Wed May 07, 2023  2304 Patient was reassessed.  She had eaten an entire sandwich, soda and juice, he was sleeping initially one of the room but he woke up he began twisting on the bed complaining of abdominal pain.  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  He will be given a small dose of 0.5 mg Dilaudid and will be discharged with Pepcid and Zofran.  Advised and encouraged to reduce alcohol consumption [MT]    Clinical Course User Index [MT] Lemmie Vanlanen, Kermit Balo, MD                                 Medical Decision Making Amount and/or Complexity of Data Reviewed Labs: ordered. Radiology: ordered. ECG/medicine tests: ordered.  Risk OTC drugs. Prescription drug management.   This patient presents to the ED with concern for substernal  epigastric pain, body pain, myalgias. This involves an extensive number of treatment options, and is a complaint that carries with it a high risk of complications and morbidity.  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  The pertinent results include: No emergent findings.  Specifically, lipase within normal limits, no significant transaminitis, troponin negative.  Patient had refused COVID and flu testing  I ordered imaging studies including x-ray of the chest I independently visualized and interpreted imaging which showed no emergent findings I agree with the radiologist interpretation  The patient was maintained on a cardiac monitor.  I personally viewed and interpreted the cardiac monitored which showed an underlying rhythm of: Sinus rhythm  Per my interpretation the patient's ECG shows no acute ischemic findings  I ordered medication including IV medications for nausea and suspected gastritis, IV pain medicine  I have reviewed the patients home medicines and have made adjustments as needed  Test Considered: No indication for emergent CT imaging of the abdomen.  He does not have an acute surgical abdomen on exam.  Patient had a negative PE study recently in the ED and have a low clinical suspicion for acute PE.  After the interventions noted above, I reevaluated the patient and found that they have: improved -easily tolerated p.o. in the ED  Social Determinants of Health: counseled on importance of alcohol cessation  Dispostion:  After consideration of the diagnostic results and the patients response to treatment, I feel that the patent would benefit from outpatient follow-up,         Final Clinical  Impression(s) / ED Diagnoses Final diagnoses:  Epigastric pain    Rx / DC Orders ED Discharge Orders          Ordered    famotidine (PEPCID) 20 MG tablet  2 times daily        05/07/23 2305    ondansetron (ZOFRAN) 4 MG tablet  Every 8 hours PRN        05/07/23 2305              Terald Sleeper, MD 05/07/23 2309

## 2023-05-07 NOTE — ED Notes (Signed)
Attempted to get sv/flu/covid swab from pt but he would not sit still and continued to move away from tech while trying to get the test done

## 2023-05-07 NOTE — ED Triage Notes (Signed)
BIB EMS for epigastric pain that radiates into chest for 3 weeks. Pt has been evaluated for this issue and workup was reassuring. Pt does use ETOH daily

## 2023-05-07 NOTE — ED Notes (Signed)
Pt uncooperative with discharge. EDP aware and security called.

## 2023-05-07 NOTE — ED Notes (Signed)
Attempted to get bloodwork from pt. Pt did not respond when called. Will try again later.

## 2023-08-01 ENCOUNTER — Emergency Department (HOSPITAL_COMMUNITY)
Admission: EM | Admit: 2023-08-01 | Discharge: 2023-08-01 | Disposition: A | Discharge status: Home / Self Care | Attending: Emergency Medicine | Admitting: Emergency Medicine

## 2023-08-01 ENCOUNTER — Other Ambulatory Visit: Payer: Self-pay

## 2023-08-01 ENCOUNTER — Emergency Department (HOSPITAL_COMMUNITY)

## 2023-08-01 ENCOUNTER — Encounter (HOSPITAL_COMMUNITY): Payer: Self-pay | Admitting: Emergency Medicine

## 2023-08-01 DIAGNOSIS — D72829 Elevated white blood cell count, unspecified: Secondary | ICD-10-CM | POA: Insufficient documentation

## 2023-08-01 DIAGNOSIS — R079 Chest pain, unspecified: Secondary | ICD-10-CM | POA: Diagnosis present

## 2023-08-01 DIAGNOSIS — Z79899 Other long term (current) drug therapy: Secondary | ICD-10-CM | POA: Insufficient documentation

## 2023-08-01 LAB — COMPREHENSIVE METABOLIC PANEL
ALT: 18 U/L (ref 0–44)
AST: 29 U/L (ref 15–41)
Albumin: 3.1 g/dL — ABNORMAL LOW (ref 3.5–5.0)
Alkaline Phosphatase: 82 U/L (ref 38–126)
Anion gap: 15 (ref 5–15)
BUN: 22 mg/dL (ref 8–23)
CO2: 18 mmol/L — ABNORMAL LOW (ref 22–32)
Calcium: 8.8 mg/dL — ABNORMAL LOW (ref 8.9–10.3)
Chloride: 103 mmol/L (ref 98–111)
Creatinine, Ser: 0.95 mg/dL (ref 0.61–1.24)
GFR, Estimated: >60 mL/min (ref >60)
Glucose, Bld: 65 mg/dL — ABNORMAL LOW (ref 70–99)
Potassium: 3.3 mmol/L — ABNORMAL LOW (ref 3.5–5.1)
Sodium: 136 mmol/L (ref 135–145)
Total Bilirubin: 0.5 mg/dL (ref 0.0–1.2)
Total Protein: 6.8 g/dL (ref 6.5–8.1)

## 2023-08-01 LAB — CBC WITH DIFFERENTIAL/PLATELET
Abs Immature Granulocytes: 0.09 10*3/uL — ABNORMAL HIGH (ref 0.00–0.07)
Basophils Absolute: 0.1 10*3/uL (ref 0.0–0.1)
Basophils Relative: 0 %
Eosinophils Absolute: 0.1 10*3/uL (ref 0.0–0.5)
Eosinophils Relative: 0 %
HCT: 37.2 % — ABNORMAL LOW (ref 39.0–52.0)
Hemoglobin: 11.7 g/dL — ABNORMAL LOW (ref 13.0–17.0)
Immature Granulocytes: 1 %
Lymphocytes Relative: 6 %
Lymphs Abs: 1.1 10*3/uL (ref 0.7–4.0)
MCH: 23 pg — ABNORMAL LOW (ref 26.0–34.0)
MCHC: 31.5 g/dL (ref 30.0–36.0)
MCV: 73.2 fL — ABNORMAL LOW (ref 80.0–100.0)
Monocytes Absolute: 1.2 10*3/uL — ABNORMAL HIGH (ref 0.1–1.0)
Monocytes Relative: 6 %
Neutro Abs: 15.8 10*3/uL — ABNORMAL HIGH (ref 1.7–7.7)
Neutrophils Relative %: 87 %
Platelets: 217 10*3/uL (ref 150–400)
RBC: 5.08 MIL/uL (ref 4.22–5.81)
RDW: 16.4 % — ABNORMAL HIGH (ref 11.5–15.5)
WBC: 18.3 10*3/uL — ABNORMAL HIGH (ref 4.0–10.5)
nRBC: 0 % (ref 0.0–0.2)

## 2023-08-01 LAB — TROPONIN I (HIGH SENSITIVITY)
Troponin I (High Sensitivity): 13 ng/L (ref <18)
Troponin I (High Sensitivity): 12 ng/L (ref <18)

## 2023-08-01 LAB — LIPASE, BLOOD: Lipase: 22 U/L (ref 11–51)

## 2023-08-01 LAB — CBG MONITORING, ED: Glucose-Capillary: 168 mg/dL — ABNORMAL HIGH (ref 70–99)

## 2023-08-01 MED ORDER — ACETAMINOPHEN 325 MG PO TABS
650.0000 mg | ORAL_TABLET | Freq: Once | ORAL | Status: AC
  Administered 2023-08-01: 650 mg via ORAL
  Filled 2023-08-01: qty 2

## 2023-08-01 NOTE — ED Provider Notes (Signed)
 [place] EMERGENCY DEPARTMENT AT [facility] Provider Note   CSN: [id number] Arrival date & time: 08/01/23  4259     History  Chief Complaint  Patient presents with   Chest Pain    Albert Ellis is a 68 y.o. male.  Patient here with some pain today.  Sounds like he was being escorted off of her property today and then he started saying his chest hurt per EMS/P TAR.  Pseudoseizures.  He is asking for something to eat and drink.  He has no weakness numbness tingling.  He does endorse some chest pain.  Denies any cough sputum production.  No abdominal pain nausea vomiting diarrhea.  The history is provided by the patient.       Home Medications Prior to Admission medications   Medication Sig Start Date End Date Taking? Authorizing Provider  atorvastatin (LIPITOR) 20 MG tablet Take 20 mg by mouth daily at 6 PM.    [provider]  calcium carbonate (TUMS - DOSED IN MG ELEMENTAL CALCIUM) 500 MG chewable tablet Chew 1 tablet by mouth 2 (two) times daily with a meal.    [provider]  cephALEXin (KEFLEX) 500 MG capsule Take 1 capsule (500 mg total) by mouth 3 (three) times daily. 09/04/22   Dione Booze, MD  diclofenac Sodium (VOLTAREN) 1 % GEL Apply 4 g topically 4 (four) times daily. 04/30/23   Carmel Sacramento A, PA-C  divalproex (DEPAKOTE) 500 MG DR tablet Take 500 mg by mouth 2 (two) times daily.    [provider]  famotidine (PEPCID) 20 MG tablet Take 1 tablet (20 mg total) by mouth 2 (two) times daily. 05/07/23 06/06/23  Terald Sleeper, MD  hydrochlorothiazide (HYDRODIURIL) 25 MG tablet Take 25 mg by mouth daily.    [provider]  metoCLOPramide (REGLAN) 10 MG tablet Take 1 tablet (10 mg total) by mouth every 6 (six) hours. 09/04/22   Dione Booze, MD  mirtazapine (REMERON) 7.5 MG tablet Take 7.5 mg by mouth at bedtime.    [provider]  OLANZapine (ZYPREXA) 15 MG tablet Take 30 mg by mouth at bedtime.    [provider]  ondansetron (ZOFRAN) 4 MG tablet Take 1 tablet (4 mg total) by mouth every 8 (eight) hours as needed for up to 12 doses for nausea or vomiting. 05/07/23   Trifan, Kermit Balo, MD      Allergies    Patient has no known allergies.    Review of Systems   Review of Systems  Physical Exam Updated Vital Signs BP 111/79 (BP Location: Right Arm)   Pulse 73   Temp 97.9 F (36.6 C)   Resp 19   Ht 5\' 9"  (1.753 m)   Wt 81.6 kg   SpO2 100%   BMI 26.58 kg/m  Physical Exam Vitals and nursing note reviewed.  Constitutional:      General: He is not in acute distress.    Appearance: He is well-developed. He is not ill-appearing.  HENT:     Head: Normocephalic and atraumatic.  Eyes:     Extraocular Movements: Extraocular movements intact.     Conjunctiva/sclera: Conjunctivae normal.     Pupils: Pupils are equal, round, and reactive to light.  Cardiovascular:     Rate and Rhythm: Normal rate and regular rhythm.     Pulses:          Radial pulses are 2+ on the right side and 2+ on the left side.  Heart sounds: Normal heart sounds. No murmur heard. Pulmonary:     Effort: Pulmonary effort is normal. No respiratory distress.     Breath sounds: Normal breath sounds. No decreased breath sounds, wheezing or rhonchi.  Abdominal:     Palpations: Abdomen is soft.     Tenderness: There is no abdominal tenderness.  Musculoskeletal:        General: No swelling. Normal range of motion.     Cervical back: Normal range of motion and neck supple.     Right lower leg: No edema.     Left lower leg: No edema.  Skin:    General: Skin is warm and dry.     Capillary Refill: Capillary refill takes less than 2 seconds.  Neurological:     Mental Status: He is alert.  Psychiatric:        Mood and Affect: Mood normal.     ED Results / Procedures / Treatments   Labs (all labs ordered are listed, but only abnormal results are displayed) Labs Reviewed  CBC WITH DIFFERENTIAL/PLATELET -  Abnormal; Notable for the following components:      Result Value   WBC 18.3 (*)    Hemoglobin 11.7 (*)    HCT 37.2 (*)    MCV 73.2 (*)    MCH 23.0 (*)    RDW 16.4 (*)    Neutro Abs 15.8 (*)    Monocytes Absolute 1.2 (*)    Abs Immature Granulocytes 0.09 (*)    All other components within normal limits  COMPREHENSIVE METABOLIC PANEL - Abnormal; Notable for the following components:   Potassium 3.3 (*)    CO2 18 (*)    Glucose, Bld 65 (*)    Calcium 8.8 (*)    Albumin 3.1 (*)    All other components within normal limits  CBG MONITORING, ED - Abnormal; Notable for the following components:   Glucose-Capillary 168 (*)    All other components within normal limits  LIPASE, BLOOD  CBG MONITORING, ED  TROPONIN I (HIGH SENSITIVITY)  TROPONIN I (HIGH SENSITIVITY)    EKG EKG Interpretation Date/Time:  Friday August 01 2023 19:20:17 EST Ventricular Rate:  67 PR Interval:  140 QRS Duration:  82 QT Interval:  398 QTC Calculation: 420 R Axis:   46  Text Interpretation: Normal sinus rhythm Nonspecific T wave abnormality When compared with ECG of 07-May-2023 20:17, PREVIOUS ECG IS PRESENT Confirmed by Virgina Norfolk [phone]) on 08/01/2023 7:23:13 PM  Radiology No results found.  Procedures Procedures    Medications Ordered in ED Medications  acetaminophen (TYLENOL) tablet 650 mg (650 mg Oral Given 08/01/23 2007)    ED Course/ Medical Decision Making/ A&P                                 Medical Decision Making Amount and/or Complexity of Data Reviewed Labs: ordered. Radiology: ordered.  Risk OTC drugs.   Albert Ellis is here with chest pain.  Normal vitals.  No fever.  Differential diagnosis could be musculoskeletal versus less likely ACS infection anemia electrolyte abnormality.  Will check basic labs including troponin chest x-ray.  EKG shows sinus rhythm.  No obvious ischemic changes.  Does not have any PE risk factors.  Does not really have any cardiac risk factors but  it does look like he might take cholesterol and blood pressure medication but cannot really confirm this.  Lab work is unremarkable.  Mild leukocytosis but no concern for infectious process.  Troponin negative x 2.  Chest x-ray shows no obvious pneumonia pneumothorax.  He was able to eat and drink without any issues.  Discharged in good condition.    This chart was dictated using voice recognition software.  Despite best efforts to proofread,  errors can occur which can change the documentation meaning.         Final Clinical Impression(s) / ED Diagnoses Final diagnoses:  Nonspecific chest pain    Rx / DC Orders ED Discharge Orders     None         Virgina Norfolk, DO 08/01/23 2223

## 2023-08-01 NOTE — Discharge Instructions (Addendum)
 Follow-up with your primary care doctor.  Workup today is normal.  Continue Tylenol and ibuprofen for pain.

## 2023-08-01 NOTE — ED Triage Notes (Signed)
 Patient brought in by [facility] complaining of generalized chest pain x1 day.   BP 140  SPO2 94% P 80

## 2024-04-13 ENCOUNTER — Ambulatory Visit (HOSPITAL_COMMUNITY): Admission: EM | Admit: 2024-04-13 | Discharge: 2024-04-13 | Disposition: A | Discharge status: Home / Self Care

## 2024-04-13 DIAGNOSIS — F1090 Alcohol use, unspecified, uncomplicated: Secondary | ICD-10-CM | POA: Diagnosis present

## 2024-04-13 DIAGNOSIS — F1010 Alcohol abuse, uncomplicated: Secondary | ICD-10-CM | POA: Diagnosis not present

## 2024-04-13 MED ORDER — ACETAMINOPHEN 325 MG PO TABS: 650.0000 mg | ORAL_TABLET | Freq: Four times a day (QID) | ORAL | Status: DC | PRN

## 2024-04-13 MED ORDER — TRAZODONE HCL 50 MG PO TABS
50.0000 mg | ORAL_TABLET | Freq: Every evening | ORAL | Status: DC | PRN
Start: 2024-04-13 — End: 2024-04-13

## 2024-04-13 MED ORDER — OLANZAPINE 5 MG PO TBDP: 5.0000 mg | ORAL_TABLET | Freq: Three times a day (TID) | ORAL | Status: DC | PRN

## 2024-04-13 MED ORDER — MAGNESIUM HYDROXIDE 400 MG/5ML PO SUSP: 30.0000 mL | Freq: Every day | ORAL | Status: DC | PRN

## 2024-04-13 NOTE — BH Assessment (Addendum)
 Comprehensive Clinical Assessment (CCA) Note  04/13/2024 Alm Pouch [id number]  Disposition: Per Alfornia Light, DO admission to [facility] is recommended.    The patient demonstrates the following risk factors for suicide: Chronic risk factors for suicide include: substance use disorder and demographic factors (male, >66 y/o). Acute risk factors for suicide include: social withdrawal/isolation and loss (financial, interpersonal, professional). Protective factors for this patient include: responsibility to others (children, family) and hope for the future. Considering these factors, the overall suicide risk at this point appears to be low. Patient is appropriate for outpatient follow up.  Patient is a 68 year old male with a history of Alcohol Use Disorder, severe who presents voluntarily to [facility] for assessment. Patient presents via [employer].  Upon assessment, patient states the reason he is here is because I love drinking and I can't stop.  Patient reports he has been drinking 6-7(40 oz) beers or a 15 pack of Natural Lite beers daily for the past year.  Patient believes the trigger for this relapse was losing 3 family members over the past year.  Patient states prior to this he was sober for 15-20 years, sharing he maintained sobriety by going to church.  Patient has no mental health or SA treatment history.  He states he has been living with a friend that he had been paying  $400/month to rent a room.  He states, She kicked me out last night.  Patient states he is now homeless.  He does not provide details as to what happened last night with his friend.  He has no other supports and states that between paying rent and buying alcohol, he is now out of money this month.  He receives SSDI and does not work.  Patient denies any psychiatric history.  He denies SI, HI and AVH.  He denies other substance use outside of ETOH use.     Chief Complaint:  Chief Complaint  Patient presents  with   Alcohol Problem   Visit Diagnosis: Alcohol Use Disorder, severe    CCA Screening, Triage and Referral (STR)  Patient Reported Information How did you hear about us ? Self  What Is the Reason for Your Visit/Call Today? Alm Pouch 68y male arrived to [facility] by [facility]. PT is currently unhoused and explains that he does not have any mental health disorders and only takes blood pressure medications. PT states that he wants to stop drinking. PT expresses that he needs help to stop drinking. PT denies SI, HI, AVH and substance use at this time.  How Long Has This Been Causing You Problems? > than 6 months  What Do You Feel Would Help You the Most Today? Alcohol or Drug Use Treatment; Housing Assistance; Financial Resources; Food Assistance; Social Support; Transportation Assistance   Have You Recently Had Any Thoughts About Hurting Yourself? No  Are You Planning to Commit Suicide/Harm Yourself At This time? No   Flowsheet Row ED from 04/13/2024 in [facility] ED from 08/01/2023 in [facility] Emergency Department at [facility] ED from 05/07/2023 in [facility] Emergency Department at [facility]  C-SSRS RISK CATEGORY No Risk No Risk No Risk    Have you Recently Had Thoughts About Hurting Someone Sherral? No  Are You Planning to Harm Someone at This Time? No  Explanation: N/A   Have You Used Any Alcohol or Drugs in the Past 24 Hours? Yes  How Long Ago Did You Use Drugs or Alcohol? yesterday What  Did You Use and How Much? Beer, too much   Do You Currently Have a Therapist/Psychiatrist? No  Name of Therapist/Psychiatrist:    Have You Been Recently Discharged From Any Office Practice or Programs? No  Explanation of Discharge From Practice/Program: N/A    CCA Screening Triage Referral Assessment Type of Contact: Face-to-Face  Telemedicine Service Delivery:   Is this Initial or Reassessment?   Date Telepsych consult ordered in  CHL:    Time Telepsych consult ordered in CHL:    Location of Assessment: [facility] Assessment Services  Provider Location: [facility] Assessment Services   Collateral Involvement: None   Does Patient Have a Automotive Engineer Guardian? No  Legal Guardian Contact Information: N/A  Copy of Legal Guardianship Form: -- (N/A)  Legal Guardian Notified of Arrival: -- (N/A)  Legal Guardian Notified of Pending Discharge: -- (N/A)  If Minor and Not Living with Parent(s), Who has Custody? N/A  Is CPS involved or ever been involved? Never  Is APS involved or ever been involved? Never   Patient Determined To Be At Risk for Harm To Self or Others Based on Review of Patient Reported Information or Presenting Complaint? No  Method: -- (N/A, no HI)  Availability of Means: -- (N/A, no HI)  Intent: -- (N/A, no HI)  Notification Required: -- (N/A, no HI)  Additional Information for Danger to Others Potential: -- (N/A, no HI)  Additional Comments for Danger to Others Potential: N/A, no HI  Are There Guns or Other Weapons in Your Home? No  Types of Guns/Weapons: N/A  Are These Weapons Safely Secured?                            -- (N/A)  Who Could Verify You Are Able To Have These Secured: N/A  Do You Have any Outstanding Charges, Pending Court Dates, Parole/Probation? Denies  Contacted To Inform of Risk of Harm To Self or Others: -- (N/A, no HI)    Does Patient Present under Involuntary Commitment? No    [place] of Residence: [place]   Patient Currently Receiving the Following Services: Not Receiving Services   Determination of Need: Urgent (48 hours)   Options For Referral: Outpatient Therapy; Intensive Outpatient Therapy; Facility-Based Crisis; BH Urgent Care     CCA Biopsychosocial Patient Reported Schizophrenia/Schizoaffective Diagnosis in Past: No   Strengths: Patient is seeking detox treatment and he is interested in Residential SA treatment as  well.   Mental Health Symptoms Depression:  None; Sleep (too much or little)   Duration of Depressive symptoms:    Mania:  None   Anxiety:   Worrying; Tension; Sleep   Psychosis:  None   Duration of Psychotic symptoms:    Trauma:  None   Obsessions:  None   Compulsions:  None   Inattention:  N/A   Hyperactivity/Impulsivity:  N/A   Oppositional/Defiant Behaviors:  N/A   Emotional Irregularity:  None   Other Mood/Personality Symptoms:  None    Mental Status Exam Appearance and self-care  Stature:  Average   Weight:  Thin   Clothing:  Disheveled   Grooming:  Neglected   Cosmetic use:  None   Posture/gait:  Normal   Motor activity:  Slowed   Sensorium  Attention:  Normal   Concentration:  Normal   Orientation:  Object; Person; Place; Time   Recall/memory:  Normal   Affect and Mood  Affect:  Congruent   Mood:  Euthymic   Relating  Eye contact:  Fleeting   Facial expression:  Responsive   Attitude toward examiner:  Cooperative   Thought and Language  Speech flow: Slurred; Soft   Thought content:  Appropriate to Mood and Circumstances   Preoccupation:  None   Hallucinations:  None   Organization:  Intact   [employer] of Knowledge:  Average   Intelligence:  Average   Abstraction:  Normal   Judgement:  Fair   Dance Movement Psychotherapist:  Adequate   Insight:  Gaps   Decision Making:  Impulsive; Vacilates   Social Functioning  Social Maturity:  Impulsive   Social Judgement:  Normal   Stress  Stressors:  Surveyor, Quantity; Relationship; Transitions   Coping Ability:  Deficient supports; Exhausted   Skill Deficits:  Interpersonal; Self-control   Supports:  Support needed     Religion: Religion/Spirituality Are You A Religious Person?: Yes What is Your Religious Affiliation?:  (NA) How Might This Affect Treatment?: States he has relied of church and stuff to maintain sobriety in the past for 15-20  years.  Leisure/Recreation: Leisure / Recreation Do You Have Hobbies?: No  Exercise/Diet: Exercise/Diet Do You Exercise?: No Have You Gained or Lost A Significant Amount of Weight in the Past Six Months?: No Do You Follow a Special Diet?: No Do You Have Any Trouble Sleeping?: Yes Explanation of Sleeping Difficulties: restless sleep - cat naps   CCA Employment/Education Employment/Work Situation: Employment / Work Situation Employment Situation: On disability Why is Patient on Disability: NA How Long has Patient Been on Disability: NA Patient's Job has Been Impacted by Current Illness:  (N/A) Has Patient ever Been in the [employer]?: No  Education: Education Is Patient Currently Attending School?: No Last Grade Completed: 12 Did You Attend College?: No Did You Have An Individualized Education Program (IIEP): No Did You Have Any Difficulty At School?: No Patient's Education Has Been Impacted by Current Illness: No   CCA Family/Childhood History Family and Relationship History: Family history Marital status: Divorced Divorced, when?: NA What types of issues is patient dealing with in the relationship?: N/A Additional relationship information: NA Does patient have children?: Yes How many children?: 2 How is patient's relationship with their children?: 2 daughters, ages 21 and 27, limited contact.  Patient states they took from me.  Childhood History:  Childhood History By whom was/is the patient raised?: Mother Did patient suffer any verbal/emotional/physical/sexual abuse as a child?: No Did patient suffer from severe childhood neglect?: No Has patient ever been sexually abused/assaulted/raped as an adolescent or adult?: No Was the patient ever a victim of a crime or a disaster?: No Witnessed domestic violence?: No Has patient been affected by domestic violence as an adult?: No       CCA Substance Use Alcohol/Drug Use: Alcohol / Drug Use Pain Medications:  See MAR Prescriptions: See MAR Over the Counter: See MAR History of alcohol / drug use?: Yes Longest period of sobriety (when/how long): 15-20 yrs Negative Consequences of Use: Financial, Personal relationships Withdrawal Symptoms: None Substance #1 Name of Substance 1: ETOH 1 - Age of First Use: 20s 1 - Amount (size/oz): 4-5 40 oz beers or a 15 pack of natural lite beers, varies 1 - Frequency: daily 1 - Duration: 1 yr ago relapsed after 15-20 yrs sober 1 - Last Use / Amount: last night-several 40 oz beers 1 - Method of Aquiring: buys 1- Route of Use: oral  ASAM's:  Six Dimensions of Multidimensional Assessment  Dimension 1:  Acute Intoxication and/or Withdrawal Potential:   Dimension 1:  Description of individual's past and current experiences of substance use and withdrawal: No s/s of w/d - denies hx of DTs  Dimension 2:  Biomedical Conditions and Complications:   Dimension 2:  Description of patient's biomedical conditions and  complications: arthritis - in wheel chair  Dimension 3:  Emotional, Behavioral, or Cognitive Conditions and Complications:  Dimension 3:  Description of emotional, behavioral, or cognitive conditions and complications: likely underlying depression - denies current depressive sx  Dimension 4:  Readiness to Change:  Dimension 4:  Description of Readiness to Change criteria: Seeking treatment, partly due to being homeless as of today, after being kicked out of friend's home where he rented a room.  Dimension 5:  Relapse, Continued use, or Continued Problem Potential:  Dimension 5:  Relapse, continued use, or continued problem potential critiera description: Low motivation for change  Dimension 6:  Recovery/Living Environment:  Dimension 6:  Recovery/Iiving environment criteria description: low support  ASAM Severity Score: ASAM's Severity Rating Score: 8  ASAM Recommended Level of Treatment: ASAM Recommended Level of Treatment:  Level III Residential Treatment   Substance use Disorder (SUD) Substance Use Disorder (SUD)  Checklist Symptoms of Substance Use: Continued use despite having a persistent/recurrent physical/psychological problem caused/exacerbated by use, Continued use despite persistent or recurrent social, interpersonal problems, caused or exacerbated by use, Evidence of tolerance, Recurrent use that results in a failure to fulfill major role obligations (work, school, home)  Recommendations for Services/Supports/Treatments: Recommendations for Services/Supports/Treatments Recommendations For Services/Supports/Treatments: Facility Based Crisis  Disposition Recommendation per psychiatric provider: We recommend transfer to [facility].  Admit to [facility].   DSM5 Diagnoses: There are no active problems to display for this patient.    Referrals to Alternative Service(s): Referred to Alternative Service(s):   Place:   Date:   Time:    Referred to Alternative Service(s):   Place:   Date:   Time:    Referred to Alternative Service(s):   Place:   Date:   Time:    Referred to Alternative Service(s):   Place:   Date:   Time:     Deland LITTIE Louder, [facility]

## 2024-04-13 NOTE — Progress Notes (Signed)
   04/13/24 1520  [facility] Triage Screening (Walk-ins at [facility] only)  What Is the Reason for Your Visit/Call Today? Albert Ellis 68y male arrived to [facility] by [facility]. PT is currently unhoused and explains that he does not have any mental health disorders and only takes blood pressure medications. PT states that he wants to stop drinking. PT expresses that he needs help to stop drinking. PT denies SI, HI, AVH and substance use at this time.  How Long Has This Been Causing You Problems? > than 6 months  Have You Recently Had Any Thoughts About Hurting Yourself? No  Are You Planning to Commit Suicide/Harm Yourself At This time? No  Have you Recently Had Thoughts About Hurting Someone Sherral? No  Are You Planning To Harm Someone At This Time? No  Physical Abuse Denies  Verbal Abuse Denies  Sexual Abuse Denies  Exploitation of patient/patient's resources Yes, present (Comment) (PT commented yes)  Self-Neglect Yes, present (Comment)  Are you currently experiencing any auditory, visual or other hallucinations? No  Have You Used Any Alcohol or Drugs in the Past 24 Hours? Yes  What Did You Use and How Much? Beer, too much  Do you have any current medical co-morbidities that require immediate attention? No  Clinician description of patient physical appearance/behavior: foul odor, poor hygiene, calm, cooperative, hard to understand  What Do You Feel Would Help You the Most Today? Alcohol or Drug Use Treatment;Housing Assistance;Financial Resources;Food Assistance;Social Support;Transportation Assistance  Determination of Need Routine (7 days)  Options For Referral Outpatient Therapy;Intensive Outpatient Therapy;Facility-Based Crisis;BH Urgent Care

## 2024-04-13 NOTE — Discharge Instructions (Signed)
Patient is instructed prior to discharge to:  Take all medications as prescribed by his/her mental healthcare provider. Report any adverse effects and or reactions from the medicines to his/her outpatient provider promptly. Keep all scheduled appointments, to ensure that you are getting refills on time and to avoid any interruption in your medication.  If you are unable to keep an appointment call to reschedule.  Be sure to follow-up with resources and follow-up appointments provided.  Patient has been instructed & cautioned: To not engage in alcohol and or illegal drug use while on prescription medicines. In the event of worsening symptoms, patient is instructed to call the crisis hotline, 911 and or go to the nearest ED for appropriate evaluation and treatment of symptoms. To follow-up with his/her primary care provider for your other medical issues, concerns and or health care needs.    

## 2024-04-13 NOTE — ED Provider Notes (Incomplete)
 [facility] Urgent Care Continuous Assessment Admission H&P  Date: 04/13/24 Patient Name: Albert Ellis MRN: [id number] Chief Complaint:  Anthon do nothing to help myself  Diagnoses:  Final diagnoses:  Alcohol use disorder   HPI: Albert Ellis is a 68 year old male who presented voluntarily to [facility] requesting help for his alcohol use disorder.   On exam, patient's thought processes can be sometimes goal-directed, or irrelevant and disorganized, with circumstantial associations.  Both patient says I love drinking and cannot stop.  Patient reports drinking about 4-5 40 ounce beers daily, sometimes he likes to mix it up with a 15 pack natty light.  Patient is very garbled speech and is difficult to understand, and displaying significant psychomotor agitation, rocking back and forth and rubbing his arms and legs.  Patient says previously he has been able to maintain sobriety for a period of 15 to 20 years, which he attributes to going to church.  Patient says the last time he was sober, was about 1 year ago.  Patient says that 3 of his family members have died, and this caused him to relapse. Patient denies any withdrawal symptoms of DTs or seizures from his alcohol use show patient is poor historian.  Patient denies any other substance use history, or detoxification/residential rehab.  Patient denies any prior psychiatric history,     Total Time spent with patient: 30 minutes  Musculoskeletal  Strength & Muscle Tone: atrophy Gait & Station: unsteady Patient leans: N/A  Psychiatric Specialty Exam  Presentation General Appearance:  Bizarre; Disheveled  Eye Contact: Good  Speech: Garbled  Speech Volume: Normal  Handedness:No data recorded  Mood and Affect  Mood: -- (good)  Affect: Congruent; Full Range  Thought Process  Thought Processes: Irrevelant  Descriptions of Associations:Tangential  Orientation:Full (Time, Place and Person)  Thought Content:WDL     Hallucinations:Hallucinations: None  Ideas of Reference:None  Suicidal Thoughts:Suicidal Thoughts: No  Homicidal Thoughts:Homicidal Thoughts: No  Sensorium  Memory: Immediate Poor  Judgment: Poor  Insight: Poor  Executive Functions  Concentration: Fair  Attention Span: Fair  Recall: Fair  Fund of Knowledge: Fair  Language: Fair  Psychomotor Activity  Psychomotor Activity: Psychomotor Activity: Increased  Assets  Assets: Physical Health; Resilience  Sleep  Sleep: Sleep: Poor   No data recorded  Physical Exam ROS  There were no vitals taken for this visit. There is no height or weight on file to calculate BMI.  Past Psychiatric History: Denies all  Is the patient at risk to self? No  Has the patient been a risk to self in the past 6 months? No .    Has the patient been a risk to self within the distant past? No   Is the patient a risk to others? No   Has the patient been a risk to others in the past 6 months? No   Has the patient been a risk to others within the distant past? No   Past Medical History: HTN, OA  Family History: unknown  Social History: Currently homeless, has been staying with a woman, and pays her $400 rent each month.  Patient receives SSDI, about $1000 a month. 2 daughters, ages 108 and 1, no relationship with them  Last Labs:  No visits with results within 6 Month(s) from this visit.  Latest known visit with results is:  Admission on 08/01/2023, Discharged on 08/01/2023  Component Date Value Ref Range Status   WBC 08/01/2023 18.3 (H)  4.0 - 10.5 K/uL Final   RBC 08/01/2023  5.08  4.22 - 5.81 MIL/uL Final   Hemoglobin 08/01/2023 11.7 (L)  13.0 - 17.0 g/dL Final   HCT 96/92/7974 37.2 (L)  39.0 - 52.0 % Final   MCV 08/01/2023 73.2 (L)  80.0 - 100.0 fL Final   MCH 08/01/2023 23.0 (L)  26.0 - 34.0 pg Final   MCHC 08/01/2023 31.5  30.0 - 36.0 g/dL Final   RDW 96/92/7974 16.4 (H)  11.5 - 15.5 % Final   Platelets 08/01/2023  217  150 - 400 K/uL Final   REPEATED TO VERIFY   nRBC 08/01/2023 0.0  0.0 - 0.2 % Final   Neutrophils Relative % 08/01/2023 87  % Final   Neutro Abs 08/01/2023 15.8 (H)  1.7 - 7.7 K/uL Final   Lymphocytes Relative 08/01/2023 6  % Final   Lymphs Abs 08/01/2023 1.1  0.7 - 4.0 K/uL Final   Monocytes Relative 08/01/2023 6  % Final   Monocytes Absolute 08/01/2023 1.2 (H)  0.1 - 1.0 K/uL Final   Eosinophils Relative 08/01/2023 0  % Final   Eosinophils Absolute 08/01/2023 0.1  0.0 - 0.5 K/uL Final   Basophils Relative 08/01/2023 0  % Final   Basophils Absolute 08/01/2023 0.1  0.0 - 0.1 K/uL Final   Immature Granulocytes 08/01/2023 1  % Final   Abs Immature Granulocytes 08/01/2023 0.09 (H)  0.00 - 0.07 K/uL Final   Performed at [facility], [address] [place], [place] [address]   Sodium 08/01/2023 136  135 - 145 mmol/L Final   Potassium 08/01/2023 3.3 (L)  3.5 - 5.1 mmol/L Final   Chloride 08/01/2023 103  98 - 111 mmol/L Final   CO2 08/01/2023 18 (L)  22 - 32 mmol/L Final   Glucose, Bld 08/01/2023 65 (L)  70 - 99 mg/dL Final   Glucose reference range applies only to samples taken after fasting for at least 8 hours.   BUN 08/01/2023 22  8 - 23 mg/dL Final   Creatinine, Ser 08/01/2023 0.95  0.61 - 1.24 mg/dL Final   Calcium 96/92/7974 8.8 (L)  8.9 - 10.3 mg/dL Final   Total Protein 96/92/7974 6.8  6.5 - 8.1 g/dL Final   Albumin 96/92/7974 3.1 (L)  3.5 - 5.0 g/dL Final   AST 96/92/7974 29  15 - 41 U/L Final   ALT 08/01/2023 18  0 - 44 U/L Final   Alkaline Phosphatase 08/01/2023 82  38 - 126 U/L Final   Total Bilirubin 08/01/2023 0.5  0.0 - 1.2 mg/dL Final   GFR, Estimated 08/01/2023 >60  >60 mL/min Final   Comment: (NOTE) Calculated using the CKD-EPI Creatinine Equation (2021)    Anion gap 08/01/2023 15  5 - 15 Final   Performed at [facility], [address] [place], [place] [address]   Troponin I (High Sensitivity) 08/01/2023 13  <18 ng/L Final   Comment:  (NOTE) Elevated high sensitivity troponin I (hsTnI) values and significant  changes across serial measurements may suggest ACS but many other  chronic and acute conditions are known to elevate hsTnI results.  Refer to the Links section for chest pain algorithms and additional  guidance. Performed at [facility], [address] [place], [place] [address]    Lipase 08/01/2023 22  11 - 51 U/L Final   Performed at [facility], [address] [place], [place] [address]   Troponin I (High Sensitivity) 08/01/2023 12  <18 ng/L Final   Comment: (NOTE) Elevated  high sensitivity troponin I (hsTnI) values and significant  changes across serial measurements may suggest ACS but many other  chronic and acute conditions are known to elevate hsTnI results.  Refer to the Links section for chest pain algorithms and additional  guidance. Performed at [facility], [address] [place], [place] [address]    Glucose-Capillary 08/01/2023 168 (H)  70 - 99 mg/dL Final   Glucose reference range applies only to samples taken after fasting for at least 8 hours.   Allergies: Patient has no known allergies.  Medications:  PTA Medications  Medication Sig   atorvastatin (LIPITOR) 20 MG tablet Take 20 mg by mouth daily at 6 PM.   hydrochlorothiazide (HYDRODIURIL) 25 MG tablet Take 25 mg by mouth daily.   calcium carbonate (TUMS - DOSED IN MG ELEMENTAL CALCIUM) 500 MG chewable tablet Chew 1 tablet by mouth 2 (two) times daily with a meal.   mirtazapine (REMERON) 7.5 MG tablet Take 7.5 mg by mouth at bedtime.   divalproex (DEPAKOTE) 500 MG DR tablet Take 500 mg by mouth 2 (two) times daily.   OLANZapine (ZYPREXA) 15 MG tablet Take 30 mg by mouth at bedtime.   metoCLOPramide  (REGLAN ) 10 MG tablet Take 1 tablet (10 mg total) by mouth every 6 (six) hours.   cephALEXin  (KEFLEX ) 500 MG capsule Take 1 capsule (500 mg total) by mouth 3 (three) times daily.   diclofenac  Sodium (VOLTAREN ) 1 %  GEL Apply 4 g topically 4 (four) times daily.   famotidine  (PEPCID ) 20 MG tablet Take 1 tablet (20 mg total) by mouth 2 (two) times daily.   ondansetron  (ZOFRAN ) 4 MG tablet Take 1 tablet (4 mg total) by mouth every 8 (eight) hours as needed for up to 12 doses for nausea or vomiting.     Medical Decision Making  ***Ryane Canavan is a 68 year old male who presented voluntarily to [facility] requesting help for his alcohol use disorder.  Patient is disheveled, chronically homeless, and with bizarre affect, though motivated to pursue residential rehabilitation.  Patient is a poor historian, and has been endorsing a significant amount of daily alcohol use, so plan to admit patient to observation unit to monitor overnight while monitoring for signs and symptoms of alcohol withdrawal, and provide him with resources for residential rehabilitation in the morning.  Patient is not appropriate for [facility] admission, given level of assist required to help patient with ADLs.    Recommendations  Based on my evaluation the patient does not appear to have an emergency medical condition.  Alfornia Light, DO 04/13/24  4:08 PM

## 2024-04-13 NOTE — ED Provider Notes (Addendum)
 Behavioral Health Urgent Care Medical Screening Exam  Patient Name: Albert Ellis MRN: [id number] Date of Evaluation: 04/13/24 Chief Complaint:  Cannot do nothing to help myself  Diagnosis:  Final diagnoses:  Alcohol use disorder   History of Present illness: Albert Ellis is a 68 y.o. male  who presented voluntarily to [facility] requesting help for his alcohol use disorder.    On exam, patient's thought processes can be sometimes goal-directed, or irrelevant with circumstantial associations.  Both patient says I love drinking and cannot stop.  Patient reports drinking about 4-5 40 ounce beers daily, sometimes he likes to mix it up with a 15 pack natty light.  Patient has very garbled speech and is difficult to understand, and denies being currently intoxicated. Though patient's thought process is occasionally irrelevant, he is cognitively WNL, alert with fair attention and oriented to person, place, time and situation.     Patient says previously he has been able to maintain sobriety for a period of 15 to 20 years, which he attributes to going to church.  Patient says the last time he was sober, was about 1 year ago.  Patient says that 3 of his family members have died, and this caused him to relapse. Patient denies any withdrawal symptoms of DTs or seizures from his alcohol use show patient is poor historian.  Patient denies any other substance use history, or detoxification/residential rehab.  Patient denies any prior psychiatric history, as well.   Patient [facility], however patient unable to tend to his ADLs, requires significant help with transfers and higher level of care than we can provide.  Patient was discharged with bus pass and instructions to self-present to residential rehabilitation facility for treatment.  Flowsheet Row ED from 04/13/2024 in [facility] ED from 08/01/2023 in [facility] Emergency Department at [facility] ED  from 05/07/2023 in [facility] Emergency Department at [facility]  C-SSRS RISK CATEGORY No Risk No Risk No Risk   Psychiatric Specialty Exam  Presentation  General Appearance:Bizarre; Disheveled  Eye Contact:Good  Speech:Garbled  Speech Volume:Normal  Handedness:No data recorded  Mood and Affect  Mood: -- (good)  Affect: Congruent; Full Range  Thought Process  Thought Processes: Irrevelant; Disorganized; Goal Directed  Descriptions of Associations:Circumstantial  Orientation:Full (Time, Place and Person)  Thought Content:WDL  Diagnosis of Schizophrenia or Schizoaffective disorder in past: No   Hallucinations:None  Ideas of Reference:None  Suicidal Thoughts:No  Homicidal Thoughts:No  Sensorium  Memory: Immediate Poor  Judgment: Poor  Insight: Poor  Executive Functions  Concentration: Fair  Attention Span: Fair  Recall: Fair  Fund of Knowledge: Fair  Language: Fair  Psychomotor Activity  Psychomotor Activity: Increased  Assets  Assets: Physical Health; Resilience  Sleep  Sleep: Poor  Number of hours: No data recorded  Physical Exam: Physical Exam Pulmonary:     Effort: Pulmonary effort is normal.  Skin:    General: Skin is dry.  Neurological:     Mental Status: He is oriented to person, place, and time.  Psychiatric:        Behavior: Behavior is cooperative.    Review of Systems  Musculoskeletal:  Positive for joint pain.  Psychiatric/Behavioral:  Positive for substance abuse. Negative for depression, hallucinations and suicidal ideas. The patient is not nervous/anxious.    Blood pressure 112/72, pulse 97, temperature 99 F (37.2 C), temperature source Oral, resp. rate 16, SpO2 93%. There is no height or weight on file to calculate  BMI.  Musculoskeletal: Strength & Muscle Tone: atrophy Gait & Station: unsteady, ataxic Patient leans: N/A  [facility] MSE Discharge Disposition for Follow up and  Recommendations: Based on my evaluation the patient does not appear to have an emergency medical condition and can be discharged with resources and follow up care in outpatient services for residential rehabilitation.  Alfornia Light, DO 04/13/2024, 5:41 PM

## 2024-04-22 ENCOUNTER — Emergency Department (HOSPITAL_COMMUNITY)
Admission: EM | Admit: 2024-04-22 | Discharge: 2024-04-22 | Disposition: A | Discharge status: Home / Self Care | Attending: Emergency Medicine | Admitting: Emergency Medicine

## 2024-04-22 ENCOUNTER — Emergency Department (HOSPITAL_COMMUNITY)

## 2024-04-22 ENCOUNTER — Other Ambulatory Visit: Payer: Self-pay

## 2024-04-22 ENCOUNTER — Encounter (HOSPITAL_COMMUNITY): Payer: Self-pay

## 2024-04-22 DIAGNOSIS — J181 Lobar pneumonia, unspecified organism: Secondary | ICD-10-CM | POA: Diagnosis not present

## 2024-04-22 DIAGNOSIS — R059 Cough, unspecified: Secondary | ICD-10-CM | POA: Diagnosis present

## 2024-04-22 DIAGNOSIS — M25561 Pain in right knee: Secondary | ICD-10-CM | POA: Diagnosis not present

## 2024-04-22 DIAGNOSIS — G8929 Other chronic pain: Secondary | ICD-10-CM | POA: Insufficient documentation

## 2024-04-22 DIAGNOSIS — J189 Pneumonia, unspecified organism: Secondary | ICD-10-CM

## 2024-04-22 LAB — CBC WITH DIFFERENTIAL/PLATELET
Abs Immature Granulocytes: 0.02 K/uL (ref 0.00–0.07)
Basophils Absolute: 0 K/uL (ref 0.0–0.1)
Basophils Relative: 0 %
Eosinophils Absolute: 0.1 K/uL (ref 0.0–0.5)
Eosinophils Relative: 1 %
HCT: 38.2 % — ABNORMAL LOW (ref 39.0–52.0)
Hemoglobin: 12.5 g/dL — ABNORMAL LOW (ref 13.0–17.0)
Immature Granulocytes: 0 %
Lymphocytes Relative: 27 %
Lymphs Abs: 1.4 K/uL (ref 0.7–4.0)
MCH: 23.4 pg — ABNORMAL LOW (ref 26.0–34.0)
MCHC: 32.7 g/dL (ref 30.0–36.0)
MCV: 71.4 fL — ABNORMAL LOW (ref 80.0–100.0)
Monocytes Absolute: 0.5 K/uL (ref 0.1–1.0)
Monocytes Relative: 10 %
Neutro Abs: 3.2 K/uL (ref 1.7–7.7)
Neutrophils Relative %: 62 %
Platelets: 200 K/uL (ref 150–400)
RBC: 5.35 MIL/uL (ref 4.22–5.81)
RDW: 15 % (ref 11.5–15.5)
WBC: 5.3 K/uL (ref 4.0–10.5)
nRBC: 0 % (ref 0.0–0.2)

## 2024-04-22 LAB — COMPREHENSIVE METABOLIC PANEL WITH GFR
ALT: 15 U/L (ref 0–44)
AST: 19 U/L (ref 15–41)
Albumin: 3.4 g/dL — ABNORMAL LOW (ref 3.5–5.0)
Alkaline Phosphatase: 69 U/L (ref 38–126)
Anion gap: 8 (ref 5–15)
BUN: 24 mg/dL — ABNORMAL HIGH (ref 8–23)
CO2: 26 mmol/L (ref 22–32)
Calcium: 9 mg/dL (ref 8.9–10.3)
Chloride: 104 mmol/L (ref 98–111)
Creatinine, Ser: 0.91 mg/dL (ref 0.61–1.24)
GFR, Estimated: >60 mL/min (ref >60)
Glucose, Bld: 84 mg/dL (ref 70–99)
Potassium: 4 mmol/L (ref 3.5–5.1)
Sodium: 138 mmol/L (ref 135–145)
Total Bilirubin: 0.8 mg/dL (ref 0.0–1.2)
Total Protein: 7.1 g/dL (ref 6.5–8.1)

## 2024-04-22 LAB — LIPASE, BLOOD: Lipase: 29 U/L (ref 11–51)

## 2024-04-22 LAB — TROPONIN I (HIGH SENSITIVITY): Troponin I (High Sensitivity): 9 ng/L (ref <18)

## 2024-04-22 MED ORDER — AMOXICILLIN-POT CLAVULANATE 875-125 MG PO TABS
1.0000 | ORAL_TABLET | Freq: Once | ORAL | Status: AC
  Administered 2024-04-22: 1 via ORAL
  Filled 2024-04-22: qty 1

## 2024-04-22 MED ORDER — AMOXICILLIN-POT CLAVULANATE 875-125 MG PO TABS: 1.0000 | ORAL_TABLET | Freq: Two times a day (BID) | ORAL | 0 refills | Status: DC

## 2024-04-22 NOTE — ED Triage Notes (Signed)
 Patient was acting disorderly at the [employer] store from what was states by [employer]. [employer] was called and once arriving patient started to complain of right knee pain and chest discomfort. EMS was called and patient was brought to the ED. EMS states that the patient is homeless and wheelchair bound. Patient states that he drank a half a bottle of vodka.

## 2024-04-22 NOTE — ED Provider Notes (Signed)
 [facility] Emergency Department Provider Note MRN:  [id number]  Arrival date & time: 04/22/24     Chief Complaint   Chest Pain and Knee Pain   History of Present Illness   Albert Ellis is a 68 y.o. year-old male presents to the ED with chief complaint of cough, chest pain, and right knee pain.  Brought in from the [place] due to disruptive behavior.  Apparently the store called [employer], but when [employer] arrived he began complaining of chest pain and right knee pain.  He states that he had been drinking earlier.  He denies fever or chills.  He states that he has had cough.  States that coughing causes his chest to hurt.  States that he has chronic right knee pain, but denies any new or recent injury.SABRA  History provided by patient.   Review of Systems  Pertinent positive and negative review of systems noted in HPI.    Physical Exam   Vitals:   04/22/24 0255 04/22/24 0323  BP: 132/75   Pulse: 88   Resp: 18   Temp: (!) 97.4 F (36.3 C)   SpO2: 99% 99%    CONSTITUTIONAL:  non toxic-appearing, NAD NEURO:  Alert and oriented x 3, CN 3-12 grossly intact EYES:  eyes equal and reactive ENT/NECK:  Supple, no stridor  CARDIO:  normal rate, regular rhythm, appears well-perfused  PULM:  No respiratory distress, CTAB GI/GU:  non-distended,  MSK/SPINE:  No gross deformities, no edema, moves all extremities  SKIN:  no rash, atraumatic   *Additional and/or pertinent findings included in MDM below  Diagnostic and Interventional Summary    EKG Interpretation Date/Time:    Ventricular Rate:    PR Interval:    QRS Duration:    QT Interval:    QTC Calculation:   R Axis:      Text Interpretation:         Labs Reviewed  CBC WITH DIFFERENTIAL/PLATELET - Abnormal; Notable for the following components:      Result Value   Hemoglobin 12.5 (*)    HCT 38.2 (*)    MCV 71.4 (*)    MCH 23.4 (*)    All other components within normal limits  COMPREHENSIVE METABOLIC  PANEL WITH GFR - Abnormal; Notable for the following components:   BUN 24 (*)    Albumin 3.4 (*)    All other components within normal limits  LIPASE, BLOOD  TROPONIN I (HIGH SENSITIVITY)    DG Knee 1-2 Views Right  Final Result    DG Chest 2 View  Final Result      Medications  amoxicillin -clavulanate (AUGMENTIN ) 875-125 MG per tablet 1 tablet (1 tablet Oral Given 04/22/24 0446)     Procedures  /  Critical Care Procedures  ED Course and Medical Decision Making  I have reviewed the triage vital signs, the nursing notes, and pertinent available records from the EMR.  Social Determinants Affecting Complexity of Care: Patient has no clinically significant social determinants affecting this chief complaint..   ED Course:    Medical Decision Making Patient was at a [employer] and was exhibiting disruptive behavior.  Police were called, but when they arrived patient began complaining of chest pain.  He states that he has had cough that causes his chest to hurt.  He also complains of chronic right knee pain.  He denies fever or chills.  Laboratory workup notable for no significant leukocytosis, no marked electrolyte abnormality.  Troponin is 9, the chest  pain is related to his coughing, doubt ACS.  Chest x-ray notable for a left upper lobe opacity, which is favored to be pneumonia.  Will treat with Augmentin .  Patient does not appear toxic.  He appears stable for discharge and outpatient follow-up.  Amount and/or Complexity of Data Reviewed Labs: ordered. Radiology: ordered. ECG/medicine tests: ordered.  Risk Prescription drug management.         Consultants: No consultations were needed in caring for this patient.   Treatment and Plan: I considered admission due to patient's initial presentation, but after considering the examination and diagnostic results, patient will not require admission and can be discharged with outpatient follow-up.    Final Clinical  Impressions(s) / ED Diagnoses     ICD-10-CM   1. Community acquired pneumonia of left upper lobe of lung  J18.9     2. Chronic pain of right knee  M25.561    G89.29       ED Discharge Orders          Ordered    amoxicillin -clavulanate (AUGMENTIN ) 875-125 MG tablet  Every 12 hours        04/22/24 0452              Discharge Instructions Discussed with and Provided to Patient:   Discharge Instructions   None      Vicky Charleston, PA-C 04/22/24 0459    Mesner, Selinda, MD 04/22/24 [phone]

## 2024-05-06 ENCOUNTER — Emergency Department (HOSPITAL_COMMUNITY)
Admission: EM | Admit: 2024-05-06 | Discharge: 2024-05-07 | Disposition: A | Discharge status: Home / Self Care | Attending: Emergency Medicine | Admitting: Emergency Medicine

## 2024-05-06 ENCOUNTER — Encounter (HOSPITAL_COMMUNITY): Payer: Self-pay

## 2024-05-06 ENCOUNTER — Emergency Department (HOSPITAL_COMMUNITY)

## 2024-05-06 DIAGNOSIS — Y903 Blood alcohol level of 60-79 mg/100 ml: Secondary | ICD-10-CM | POA: Insufficient documentation

## 2024-05-06 DIAGNOSIS — J189 Pneumonia, unspecified organism: Secondary | ICD-10-CM | POA: Insufficient documentation

## 2024-05-06 DIAGNOSIS — R059 Cough, unspecified: Secondary | ICD-10-CM

## 2024-05-06 DIAGNOSIS — F10920 Alcohol use, unspecified with intoxication, uncomplicated: Secondary | ICD-10-CM | POA: Insufficient documentation

## 2024-05-06 LAB — COMPREHENSIVE METABOLIC PANEL WITH GFR
ALT: 17 U/L (ref 0–44)
AST: 24 U/L (ref 15–41)
Albumin: 4.1 g/dL (ref 3.5–5.0)
Alkaline Phosphatase: 82 U/L (ref 38–126)
Anion gap: 10 (ref 5–15)
BUN: 16 mg/dL (ref 8–23)
CO2: 29 mmol/L (ref 22–32)
Calcium: 9.3 mg/dL (ref 8.9–10.3)
Chloride: 102 mmol/L (ref 98–111)
Creatinine, Ser: 0.82 mg/dL (ref 0.61–1.24)
GFR, Estimated: >60 mL/min (ref >60)
Glucose, Bld: 85 mg/dL (ref 70–99)
Potassium: 4.7 mmol/L (ref 3.5–5.1)
Sodium: 141 mmol/L (ref 135–145)
Total Bilirubin: 0.3 mg/dL (ref 0.0–1.2)
Total Protein: 7.7 g/dL (ref 6.5–8.1)

## 2024-05-06 LAB — CBC
HCT: 38.2 % — ABNORMAL LOW (ref 39.0–52.0)
Hemoglobin: 12.4 g/dL — ABNORMAL LOW (ref 13.0–17.0)
MCH: 23.3 pg — ABNORMAL LOW (ref 26.0–34.0)
MCHC: 32.5 g/dL (ref 30.0–36.0)
MCV: 71.7 fL — ABNORMAL LOW (ref 80.0–100.0)
Platelets: 216 K/uL (ref 150–400)
RBC: 5.33 MIL/uL (ref 4.22–5.81)
RDW: 15.9 % — ABNORMAL HIGH (ref 11.5–15.5)
WBC: 4.9 K/uL (ref 4.0–10.5)
nRBC: 0 % (ref 0.0–0.2)

## 2024-05-06 LAB — ETHANOL: Alcohol, Ethyl (B): 71 mg/dL — ABNORMAL HIGH (ref <15)

## 2024-05-06 MED ORDER — AMOXICILLIN-POT CLAVULANATE 875-125 MG PO TABS
1.0000 | ORAL_TABLET | Freq: Once | ORAL | Status: AC
  Administered 2024-05-06: 1 via ORAL
  Filled 2024-05-06: qty 1

## 2024-05-06 MED ORDER — AMOXICILLIN-POT CLAVULANATE 875-125 MG PO TABS: 1.0000 | ORAL_TABLET | Freq: Two times a day (BID) | ORAL | 0 refills | Status: DC

## 2024-05-06 NOTE — ED Provider Notes (Signed)
 [place] EMERGENCY DEPARTMENT AT [facility] Provider Note   CSN: [id number] Arrival date & time: 05/06/24  8069     Patient presents with: Cough and Emesis   Albert Ellis is a 68 y.o. male.   HPI   68 year old male presents to the emergency department by EMS after being found trespassing at a [employer].  Alcohol intoxication reported and he had 1 episode of vomiting prior to EMS transport.  Initially patient was combative, refusing vitals.  On my evaluation, upon entering the room he is sleeping.  He wakes up.  When I ask him if he has any complaints he shrugs his shoulders.  Upon chart review looks like a couple weeks ago he was diagnosed with pneumonia and sent home with oral antibiotics.  I asked him if he has completed these and he shrugs his shoulders.  He is denying any acute pain, asking for something to eat and drink.  Moving all 4 extremities.  Speech is difficult to understand but this appears to be baseline, he has no dentition.   Prior to Admission medications  Medication Sig Start Date End Date Taking? Authorizing Provider  amoxicillin -clavulanate (AUGMENTIN ) 875-125 MG tablet Take 1 tablet by mouth every 12 (twelve) hours. 04/22/24   Vicky Charleston, PA-C  atorvastatin (LIPITOR) 20 MG tablet Take 20 mg by mouth daily at 6 PM.    [provider]  divalproex (DEPAKOTE) 500 MG DR tablet Take 500 mg by mouth 2 (two) times daily.    [provider]  hydrochlorothiazide (HYDRODIURIL) 25 MG tablet Take 25 mg by mouth daily.    [provider]  ondansetron  (ZOFRAN ) 4 MG tablet Take 1 tablet (4 mg total) by mouth every 8 (eight) hours as needed for up to 12 doses for nausea or vomiting. 05/07/23   Trifan, Donnice PARAS, MD    Allergies: Patient has no known allergies.    Review of Systems  Respiratory:  Negative for shortness of breath.   Cardiovascular:  Negative for chest pain.  Gastrointestinal:  Positive for vomiting.   Musculoskeletal:  Negative for back pain.    Updated Vital Signs BP 133/87 (BP Location: Right Arm)   Pulse 78   Temp 98.5 F (36.9 C)   Resp 18   SpO2 99%   Physical Exam Vitals and nursing note reviewed.  Constitutional:      General: He is not in acute distress.    Appearance: Normal appearance. He is not ill-appearing.  HENT:     Head: Normocephalic.     Mouth/Throat:     Mouth: Mucous membranes are moist.     Comments: Poor/missing dentition Eyes:     Extraocular Movements: Extraocular movements intact.     Pupils: Pupils are equal, round, and reactive to light.  Cardiovascular:     Rate and Rhythm: Normal rate.  Pulmonary:     Effort: Pulmonary effort is normal. No respiratory distress.  Abdominal:     Palpations: Abdomen is soft.     Tenderness: There is no abdominal tenderness.  Skin:    General: Skin is warm.  Neurological:     General: No focal deficit present.     Mental Status: He is alert and oriented to person, place, and time.  Psychiatric:     Comments: Agitated and combative at times     (all labs ordered are listed, but only abnormal results are displayed) Labs Reviewed  CBC - Abnormal; Notable for the following components:  Result Value   Hemoglobin 12.4 (*)    HCT 38.2 (*)    MCV 71.7 (*)    MCH 23.3 (*)    RDW 15.9 (*)    All other components within normal limits  COMPREHENSIVE METABOLIC PANEL WITH GFR  URINALYSIS, ROUTINE W REFLEX MICROSCOPIC  ETHANOL    EKG: None  Radiology: No results found.   Procedures   Medications Ordered in the ED - No data to display                                  Medical Decision Making Amount and/or Complexity of Data Reviewed Labs: ordered. Radiology: ordered.  Risk Prescription drug management.   68 year old male presents emergency department after trespassing at [facility], found to have 1 episode of emesis.  Complains of ongoing cough.  Recently diagnosed with pneumonia, did not  take outpatient Augmentin .  He is oriented but at times agitated/combative.  Vitals are normal and stable.  Blood work shows no leukocytosis or acute abnormality.  Alcohol is slightly elevated consistent with admitted history of recent drinking.  Chest x-ray shows ongoing pneumonia.  Patient admitted he did not take the antibiotic course that was previously prescribed to him.  I gave him a dose here and encouraged filling his prescription and taking it as directed to avoid worsening infection, condition, sepsis, death.  Patient understands.  Patient at this time appears safe and stable for discharge and close outpatient follow up. Discharge plan and strict return to ED precautions discussed, patient verbalizes understanding and agreement.     Final diagnoses:  None    ED Discharge Orders     None          Bari Roxie HERO, DO 05/06/24 2328

## 2024-05-06 NOTE — ED Triage Notes (Signed)
 Pt BIB ems from outside of [employer], coughing, alcohol intoxication, had an episode of vomiting. Not answering questions properlt, refused vitals earlier, pt behavior disorderly.

## 2024-05-06 NOTE — Discharge Instructions (Addendum)
 You have been seen and discharged from the emergency department.  You still have ongoing pneumonia that you need to take your prescribed antibiotics for.  Augmentin  has been sent to [facility].  Is important to take this.  If you do not you risk worsening infection, debility, death.  Follow-up with your primary provider for further evaluation and further care. Take home medications as prescribed. If you have any worsening symptoms or further concerns for your health please return to an emergency department for further evaluation.

## 2024-05-06 NOTE — ED Notes (Addendum)
 Cannot obtain urine sample at the moment, pt has restless, disorderly, and aggressive behavior. Pt pulls back arms and legs when attempting to do IV and urine. When placing the IV 2 RN assistance was needed to hold and calm pt and place an IV in pt.

## 2024-05-07 ENCOUNTER — Emergency Department (HOSPITAL_COMMUNITY)
Admission: EM | Admit: 2024-05-07 | Discharge: 2024-05-08 | Disposition: A | Source: Home / Self Care | Attending: Emergency Medicine | Admitting: Emergency Medicine

## 2024-05-07 DIAGNOSIS — J189 Pneumonia, unspecified organism: Secondary | ICD-10-CM | POA: Diagnosis not present

## 2024-05-07 DIAGNOSIS — J181 Lobar pneumonia, unspecified organism: Secondary | ICD-10-CM | POA: Insufficient documentation

## 2024-05-07 DIAGNOSIS — Z79899 Other long term (current) drug therapy: Secondary | ICD-10-CM | POA: Insufficient documentation

## 2024-05-07 LAB — URINALYSIS, ROUTINE W REFLEX MICROSCOPIC
Bilirubin Urine: NEGATIVE
Glucose, UA: NEGATIVE mg/dL
Ketones, ur: NEGATIVE mg/dL
Nitrite: NEGATIVE
Protein, ur: NEGATIVE mg/dL
Specific Gravity, Urine: 1.003 — ABNORMAL LOW (ref 1.005–1.030)
pH: 6 (ref 5.0–8.0)

## 2024-05-07 LAB — RAPID URINE DRUG SCREEN, HOSP PERFORMED
Amphetamines: NOT DETECTED
Barbiturates: NOT DETECTED
Benzodiazepines: NOT DETECTED
Cocaine: NOT DETECTED
Opiates: NOT DETECTED
Tetrahydrocannabinol: NOT DETECTED

## 2024-05-07 MED ORDER — ACETAMINOPHEN 325 MG PO TABS
650.0000 mg | ORAL_TABLET | Freq: Once | ORAL | Status: AC
  Administered 2024-05-07: 650 mg via ORAL
  Filled 2024-05-07: qty 2

## 2024-05-07 MED ORDER — AMOXICILLIN-POT CLAVULANATE 875-125 MG PO TABS
1.0000 | ORAL_TABLET | Freq: Once | ORAL | Status: AC
  Administered 2024-05-07: 1 via ORAL
  Filled 2024-05-07: qty 1

## 2024-05-07 MED ORDER — AMOXICILLIN-POT CLAVULANATE 875-125 MG PO TABS: 1.0000 | ORAL_TABLET | Freq: Two times a day (BID) | ORAL | 0 refills | Status: AC

## 2024-05-07 NOTE — ED Provider Notes (Signed)
 [place] EMERGENCY DEPARTMENT AT [facility] Provider Note   CSN: [id number] Arrival date & time: 05/07/24  1919     Patient presents with: Chest Pain and Cough   Albert Ellis is a 68 y.o. male.   Patient is a 68 year old male with a past medical history of alcohol use and seizures presenting to the emergency department with chest pain and cough.  The patient was seen in the emergency department yesterday with similar symptoms and was found to have a pneumonia.  He was diagnosed with pneumonia approximately 2 weeks ago but never filled his prescription of antibiotics.  He did receive antibiotics in the ED yesterday but reports that he has not yet filled his prescription.  He states that he is just having chest pain at this time.  Denies any fever or shortness of breath.  He denies drinking any alcohol today or any other substances.  He states he has not taken anything for pain.  The history is provided by the patient.  Chest Pain Associated symptoms: cough   Cough Associated symptoms: chest pain        Prior to Admission medications  Medication Sig Start Date End Date Taking? Authorizing Provider  amoxicillin -clavulanate (AUGMENTIN ) 875-125 MG tablet Take 1 tablet by mouth every 12 (twelve) hours. 05/07/24   Kingsley, Bode Pieper K, DO  atorvastatin (LIPITOR) 20 MG tablet Take 20 mg by mouth daily at 6 PM.    [provider]  divalproex (DEPAKOTE) 500 MG DR tablet Take 500 mg by mouth 2 (two) times daily.    [provider]  hydrochlorothiazide (HYDRODIURIL) 25 MG tablet Take 25 mg by mouth daily.    [provider]  ondansetron  (ZOFRAN ) 4 MG tablet Take 1 tablet (4 mg total) by mouth every 8 (eight) hours as needed for up to 12 doses for nausea or vomiting. 05/07/23   Trifan, Donnice PARAS, MD    Allergies: Patient has no known allergies.    Review of Systems  Respiratory:  Positive for cough.   Cardiovascular:  Positive for chest pain.     Updated Vital Signs BP (!) 154/89   Pulse 75   Temp 97.8 F (36.6 C) (Oral)   Resp (!) 22   SpO2 100%   Physical Exam Vitals and nursing note reviewed.  Constitutional:      Comments: Disheveled appearing, smelling of urine, fidgeting around on the stretcher bed  HENT:     Head: Normocephalic and atraumatic.  Eyes:     Extraocular Movements: Extraocular movements intact.  Cardiovascular:     Rate and Rhythm: Normal rate and regular rhythm.     Heart sounds: Normal heart sounds.  Pulmonary:     Effort: Pulmonary effort is normal.     Breath sounds: Normal breath sounds.  Abdominal:     Palpations: Abdomen is soft.     Tenderness: There is no abdominal tenderness.  Musculoskeletal:        General: Normal range of motion.     Cervical back: Normal range of motion.     Right lower leg: No edema.     Left lower leg: No edema.  Skin:    General: Skin is warm and dry.  Neurological:     Mental Status: He is alert and oriented to person, place, and time.  Psychiatric:        Behavior: Behavior is agitated.     Comments: Cooperative     (all labs ordered are listed, but only  abnormal results are displayed) Labs Reviewed  URINALYSIS, ROUTINE W REFLEX MICROSCOPIC - Abnormal; Notable for the following components:      Result Value   Color, Urine STRAW (*)    Specific Gravity, Urine 1.003 (*)    Hgb urine dipstick SMALL (*)    Leukocytes,Ua MODERATE (*)    Bacteria, UA RARE (*)    All other components within normal limits  RAPID URINE DRUG SCREEN, HOSP PERFORMED    EKG: EKG Interpretation Date/Time:  Friday May 07 2024 21:59:33 EST Ventricular Rate:  69 PR Interval:  148 QRS Duration:  92 QT Interval:  404 QTC Calculation: 433 R Axis:   -8  Text Interpretation: Sinus rhythm Since last tracing of earlier today No significant change was found Confirmed by Ellouise Fine ([id number]) on 05/07/2024 10:13:01 PM  Radiology: [facility] Chest Port 1 View Result Date:  05/06/2024 EXAM: 1 VIEW(S) XRAY OF THE CHEST 05/06/2024 09:51:22 PM COMPARISON: AP and lateral chest 04/22/2024. CLINICAL HISTORY: cough cough FINDINGS: LUNGS AND PLEURA: There is increasing consolidation in the left upper lobe suprahilar area consistent with worsening pneumonia. Follow-up study recommended to ensure clearing after treatment. Remaining lungs are clear with COPD change. No pleural effusion. No pneumothorax. HEART AND MEDIASTINUM: There is mild cardiomegaly, normal caliber central vessels. The aorta is tortuous with atherosclerosis with stable mediastinum. BONES AND SOFT TISSUES: No acute osseous abnormality. There is a tangle of overlying monitor wires. IMPRESSION: 1. Increasing consolidation in the left upper lobe suprahilar region consistent with worsening pneumonia; follow-up imaging after treatment is recommended to document resolution. 2. Mild cardiomegaly. Electronically signed by: Francis Quam MD 05/06/2024 10:00 PM EST RP Workstation: [id number]     Procedures   Medications Ordered in the ED  acetaminophen  (TYLENOL ) tablet 650 mg (650 mg Oral Given 05/07/24 2138)  amoxicillin -clavulanate (AUGMENTIN ) 875-125 MG per tablet 1 tablet (1 tablet Oral Given 05/07/24 2138)                                    Medical Decision Making This patient presents to the ED with chief complaint(s) of chest pain with pertinent past medical history of ETOH use, seizures which further complicates the presenting complaint. The complaint involves an extensive differential diagnosis and also carries with it a high risk of complications and morbidity.    The differential diagnosis includes ACS, arrhythmia, anemia, pneumonia, pneumothorax, pulmonary edema, pleural effusion, MSK pain, intoxication, malingering  Additional history obtained: Additional history obtained from EMS  Records reviewed recent ED records  ED Course and Reassessment: On patient's arrival he is hemodynamically stable in no  acute distress.  He is mildly agitated and fidgeting around the bed which he endorses is secondary to his pain.  Patient had labs and chest x-ray yesterday that did show continued pneumonia on his chest x-ray and his labs are otherwise within normal range.  He had a urine and UDS performed in triage that are within normal range.  Suspect his symptoms are secondary to his pneumonia however we will get an EKG to evaluate for ACS.  He will be given his antibiotics and pain control here and was recommended to fill his antibiotic prescription.  Patient was hemodynamically stable and afebrile here without any signs of sepsis or respiratory distress.  Independent labs interpretation:  The following labs were independently interpreted: UA/UDS within normal range  Independent visualization of imaging: - N/A  Consultation: - Consulted or  discussed management/test interpretation w/ external professional: N/A  Consideration for admission or further workup: Patient has no emergent conditions requiring admission or further work-up at this time and is stable for discharge home with primary care follow-up  Social Determinants of health: No PCP, homeless, substance use    Amount and/or Complexity of Data Reviewed Labs: ordered.  Risk OTC drugs. Prescription drug management.       Final diagnoses:  Pneumonia of left upper lobe due to infectious organism    ED Discharge Orders          Ordered    amoxicillin -clavulanate (AUGMENTIN ) 875-125 MG tablet  Every 12 hours        05/07/24 2231               Kingsley, Jashiya Bassett K, DO 05/07/24 2232

## 2024-05-07 NOTE — Discharge Instructions (Addendum)
 You were seen in the emergency department for your chest pain and your cough.  Your x-ray yesterday did show that you have pneumonia and you need to take your antibiotics.  We gave you a dose in the emergency department and you should pick up your prescription and complete this as prescribed.  You can take Tylenol  as needed for pain.  You should follow-up with your primary doctor in the next few days to have your symptoms rechecked and will need a repeat x-ray after finishing antibiotics to make sure that it is resolved.  You should return to the emergency department if you are having fevers despite the antibiotics, worsening shortness of breath or any other new or concerning symptoms.

## 2024-05-07 NOTE — ED Triage Notes (Signed)
 Patient BIB [facility] from [facility] for chest pain and a cough x 2 weeks. Patient seen yesterday for similar symptoms. Patient also reeks of urine and presents with altered behavior, however denies alcohol intoxication at this time.

## 2024-05-11 ENCOUNTER — Emergency Department (HOSPITAL_COMMUNITY): Admission: EM | Admit: 2024-05-11 | Discharge: 2024-05-12 | Disposition: A

## 2024-05-11 DIAGNOSIS — Z7409 Other reduced mobility: Secondary | ICD-10-CM | POA: Insufficient documentation

## 2024-05-11 DIAGNOSIS — Z79899 Other long term (current) drug therapy: Secondary | ICD-10-CM | POA: Diagnosis not present

## 2024-05-11 DIAGNOSIS — Z993 Dependence on wheelchair: Secondary | ICD-10-CM | POA: Diagnosis not present

## 2024-05-11 DIAGNOSIS — Y900 Blood alcohol level of less than 20 mg/100 ml: Secondary | ICD-10-CM | POA: Diagnosis not present

## 2024-05-11 DIAGNOSIS — F1010 Alcohol abuse, uncomplicated: Secondary | ICD-10-CM | POA: Diagnosis present

## 2024-05-11 NOTE — ED Provider Triage Note (Incomplete)
 Emergency Medicine Provider Triage Evaluation Note  Albert Ellis , a 68 y.o. male  was evaluated in triage.  Pt complains of ***.  Review of Systems  Positive: *** Negative: ***  Physical Exam  BP (!) 128/91   Pulse 72   Temp 98.3 F (36.8 C) (Axillary)   Resp 17   SpO2 98%  Gen:   Awake, no distress  *** Resp:  Normal effort *** MSK:   Moves extremities without difficulty *** Other:  ***  Medical Decision Making  Medically screening exam initiated at 10:24 PM.  Appropriate orders placed.  Albert Ellis was informed that the remainder of the evaluation will be completed by another provider, this initial triage assessment does not replace that evaluation, and the importance of remaining in the ED until their evaluation is complete.  ***

## 2024-05-11 NOTE — ED Triage Notes (Signed)
 PT bib ems from [facility]. Pt stated he had too much to drink and is cold and wants to come to hospital. Fire department pour out approx .75 liters of vodka that pt was drinking. PT is own wheelchair due to limited lower motor function. Pt would not cooperate with EMS to get vitals and would not answer all questions however when he was cooperating with EMS all questions he answered were A&Ox 4

## 2024-05-12 LAB — BASIC METABOLIC PANEL WITH GFR
Anion gap: 14 (ref 5–15)
BUN: 16 mg/dL (ref 8–23)
CO2: 20 mmol/L — ABNORMAL LOW (ref 22–32)
Calcium: 9.1 mg/dL (ref 8.9–10.3)
Chloride: 105 mmol/L (ref 98–111)
Creatinine, Ser: 0.89 mg/dL (ref 0.61–1.24)
GFR, Estimated: >60 mL/min (ref >60)
Glucose, Bld: 87 mg/dL (ref 70–99)
Potassium: 4.2 mmol/L (ref 3.5–5.1)
Sodium: 139 mmol/L (ref 135–145)

## 2024-05-12 LAB — CBC
HCT: 41.6 % (ref 39.0–52.0)
Hemoglobin: 13.5 g/dL (ref 13.0–17.0)
MCH: 23.6 pg — ABNORMAL LOW (ref 26.0–34.0)
MCHC: 32.5 g/dL (ref 30.0–36.0)
MCV: 72.7 fL — ABNORMAL LOW (ref 80.0–100.0)
Platelets: 225 K/uL (ref 150–400)
RBC: 5.72 MIL/uL (ref 4.22–5.81)
RDW: 16.2 % — ABNORMAL HIGH (ref 11.5–15.5)
WBC: 3.3 K/uL — ABNORMAL LOW (ref 4.0–10.5)
nRBC: 0 % (ref 0.0–0.2)

## 2024-05-12 LAB — ETHANOL: Alcohol, Ethyl (B): 18 mg/dL — ABNORMAL HIGH (ref <15)

## 2024-05-12 LAB — MAGNESIUM: Magnesium: 2.1 mg/dL (ref 1.7–2.4)

## 2024-05-12 LAB — CBG MONITORING, ED: Glucose-Capillary: 86 mg/dL (ref 70–99)

## 2024-05-12 NOTE — ED Provider Notes (Signed)
  EMERGENCY DEPARTMENT AT [facility] Provider Note   CSN: [id number] Arrival date & time: 05/11/24  2147     Patient presents with: No chief complaint on file.   Albert Ellis is a 68 y.o. male.   68 year old male presents for evaluation of alcohol use.  He states he is cold and drink too much.  He is sleeping in the wheelchair.  Is not very forthcoming with history.  He denies any falls or injuries.  Denies any other symptoms or concerns.        Prior to Admission medications  Medication Sig Start Date End Date Taking? Authorizing Provider  amoxicillin -clavulanate (AUGMENTIN ) 875-125 MG tablet Take 1 tablet by mouth every 12 (twelve) hours. 05/07/24   Kingsley, Victoria K, DO  atorvastatin (LIPITOR) 20 MG tablet Take 20 mg by mouth daily at 6 PM.    [provider]  divalproex (DEPAKOTE) 500 MG DR tablet Take 500 mg by mouth 2 (two) times daily.    [provider]  hydrochlorothiazide (HYDRODIURIL) 25 MG tablet Take 25 mg by mouth daily.    [provider]  ondansetron  (ZOFRAN ) 4 MG tablet Take 1 tablet (4 mg total) by mouth every 8 (eight) hours as needed for up to 12 doses for nausea or vomiting. 05/07/23   Trifan, Donnice PARAS, MD    Allergies: Patient has no known allergies.    Review of Systems  Constitutional:  Positive for fatigue. Negative for chills and fever.  HENT:  Negative for ear pain and sore throat.   Eyes:  Negative for pain and visual disturbance.  Respiratory:  Negative for cough and shortness of breath.   Cardiovascular:  Negative for chest pain and palpitations.  Gastrointestinal:  Negative for abdominal pain and vomiting.  Genitourinary:  Negative for dysuria and hematuria.  Musculoskeletal:  Negative for arthralgias and back pain.  Skin:  Negative for color change and rash.  Neurological:  Negative for seizures and syncope.  All other systems reviewed and are negative.   Updated Vital Signs BP (!)  128/91   Pulse 72   Temp 98.3 F (36.8 C) (Axillary)   Resp 17   SpO2 98%   Physical Exam Vitals and nursing note reviewed.  Constitutional:      General: He is not in acute distress.    Appearance: Normal appearance. He is well-developed. He is not ill-appearing.  HENT:     Head: Normocephalic and atraumatic.  Eyes:     Conjunctiva/sclera: Conjunctivae normal.  Cardiovascular:     Rate and Rhythm: Normal rate and regular rhythm.     Heart sounds: No murmur heard. Pulmonary:     Effort: Pulmonary effort is normal. No respiratory distress.     Breath sounds: Normal breath sounds.  Abdominal:     Palpations: Abdomen is soft.     Tenderness: There is no abdominal tenderness.  Musculoskeletal:        General: No swelling.     Cervical back: Neck supple.  Skin:    General: Skin is warm and dry.     Capillary Refill: Capillary refill takes less than 2 seconds.  Neurological:     General: No focal deficit present.     Mental Status: He is alert.     (all labs ordered are listed, but only abnormal results are displayed) Labs Reviewed  CBC - Abnormal; Notable for the following components:      Result Value   WBC 3.3 (*)  MCV 72.7 (*)    MCH 23.6 (*)    RDW 16.2 (*)    All other components within normal limits  BASIC METABOLIC PANEL WITH GFR - Abnormal; Notable for the following components:   CO2 20 (*)    All other components within normal limits  ETHANOL - Abnormal; Notable for the following components:   Alcohol, Ethyl (B) 18 (*)    All other components within normal limits  MAGNESIUM   CBG MONITORING, ED    EKG: None  Radiology: No results found.   Procedures   Medications Ordered in the ED - No data to display                                  Medical Decision Making Social determinants of health: Alcohol and tobacco abuse  Patient here for feeling cold and he states he drank too much today.  Alcohol is only 18, he appears clinically sober.  Is  easy to wake up on exam and has no other complaints.  He will be discharged.  Advised close up with primary care doctor and counseled on cessation of smoking and tobacco.  Advise return for new or worsening symptoms.  He feels comfortable to plan to be discharged.  Problems Addressed: Alcohol abuse: chronic illness or injury  Amount and/or Complexity of Data Reviewed External Data Reviewed: notes.    Details: Prior ED records reviewed and patient seen 05-07-2024 for pneumonia Labs: ordered. Decision-making details documented in ED Course.    Details: Ordered and unremarkable  Risk OTC drugs. Prescription drug management. Diagnosis or treatment significantly limited by social determinants of health.     Final diagnoses:  Alcohol abuse    ED Discharge Orders     None          Gennaro Duwaine CROME, DO 05/12/24 [phone]

## 2024-05-12 NOTE — ED Notes (Signed)
 Patient refused d/c vitals

## 2024-05-25 ENCOUNTER — Emergency Department (HOSPITAL_COMMUNITY)
Admission: EM | Admit: 2024-05-25 | Discharge: 2024-05-26 | Disposition: A | Discharge status: Home / Self Care | Attending: Emergency Medicine | Admitting: Emergency Medicine

## 2024-05-25 ENCOUNTER — Other Ambulatory Visit: Payer: Self-pay

## 2024-05-25 ENCOUNTER — Emergency Department (HOSPITAL_COMMUNITY)

## 2024-05-25 DIAGNOSIS — M1711 Unilateral primary osteoarthritis, right knee: Secondary | ICD-10-CM | POA: Insufficient documentation

## 2024-05-25 DIAGNOSIS — R918 Other nonspecific abnormal finding of lung field: Secondary | ICD-10-CM | POA: Insufficient documentation

## 2024-05-25 DIAGNOSIS — Z59819 Housing instability, housed unspecified: Secondary | ICD-10-CM | POA: Insufficient documentation

## 2024-05-25 DIAGNOSIS — F1010 Alcohol abuse, uncomplicated: Secondary | ICD-10-CM | POA: Insufficient documentation

## 2024-05-25 DIAGNOSIS — J984 Other disorders of lung: Secondary | ICD-10-CM

## 2024-05-25 DIAGNOSIS — R072 Precordial pain: Secondary | ICD-10-CM | POA: Diagnosis not present

## 2024-05-25 DIAGNOSIS — F1090 Alcohol use, unspecified, uncomplicated: Secondary | ICD-10-CM

## 2024-05-25 DIAGNOSIS — G8929 Other chronic pain: Secondary | ICD-10-CM

## 2024-05-25 DIAGNOSIS — M25561 Pain in right knee: Secondary | ICD-10-CM | POA: Diagnosis present

## 2024-05-25 LAB — CBC
HCT: 39.1 % (ref 39.0–52.0)
Hemoglobin: 12.6 g/dL — ABNORMAL LOW (ref 13.0–17.0)
MCH: 23.4 pg — ABNORMAL LOW (ref 26.0–34.0)
MCHC: 32.2 g/dL (ref 30.0–36.0)
MCV: 72.5 fL — ABNORMAL LOW (ref 80.0–100.0)
Platelets: 222 K/uL (ref 150–400)
RBC: 5.39 MIL/uL (ref 4.22–5.81)
RDW: 16.3 % — ABNORMAL HIGH (ref 11.5–15.5)
WBC: 7.9 K/uL (ref 4.0–10.5)
nRBC: 0 % (ref 0.0–0.2)

## 2024-05-25 LAB — BASIC METABOLIC PANEL WITH GFR
Anion gap: 10 (ref 5–15)
BUN: 12 mg/dL (ref 8–23)
CO2: 25 mmol/L (ref 22–32)
Calcium: 9.2 mg/dL (ref 8.9–10.3)
Chloride: 103 mmol/L (ref 98–111)
Creatinine, Ser: 0.74 mg/dL (ref 0.61–1.24)
GFR, Estimated: >60 mL/min
Glucose, Bld: 86 mg/dL (ref 70–99)
Potassium: 4.1 mmol/L (ref 3.5–5.1)
Sodium: 138 mmol/L (ref 135–145)

## 2024-05-25 LAB — TROPONIN T, HIGH SENSITIVITY: Troponin T High Sensitivity: 21 ng/L — ABNORMAL HIGH (ref 0–19)

## 2024-05-25 NOTE — ED Triage Notes (Signed)
 Pt BIB [facility] c/o  CP for the past 2 weeks. Pt reports sharp shooting pains in chest. Pt also reports migraine lasting for 1  month and chronic R knee pain. Pt received 324 ASA en route per EMS

## 2024-05-25 NOTE — ED Provider Triage Note (Signed)
 Emergency Medicine Provider Triage Evaluation Note  Albert Ellis , a 68 y.o. male  was evaluated in triage.  Pt complains of chest pain.  Patient states he had chest pain, chills, and vomiting.  States last time he had this he had a myocardial infarction.  Patient's history is limited due to his tardive dyskinesia and speech.  Review of Systems  Positive: See HPI Negative: Denies shortness of breath, fevers, chills, coughing.  Physical Exam  BP 139/79   Pulse (!) 39   Temp 98.2 F (36.8 C) (Oral)   Resp 18   Ht 6' 1 (1.854 m)   Wt 68 kg   SpO2 92%   BMI 19.79 kg/m  Gen:   Awake, no distress   Resp:  Normal effort  MSK:   Moves extremities without difficulty  Other:    Medical Decision Making  Medically screening exam initiated at 10:42 PM.  Appropriate orders placed.  Albert Ellis was informed that the remainder of the evaluation will be completed by another provider, this initial triage assessment does not replace that evaluation, and the importance of remaining in the ED until their evaluation is complete.     Elnor Bernarda SQUIBB, DO 05/25/24 2242

## 2024-05-26 ENCOUNTER — Emergency Department (HOSPITAL_COMMUNITY)

## 2024-05-26 ENCOUNTER — Other Ambulatory Visit (HOSPITAL_COMMUNITY): Payer: Self-pay

## 2024-05-26 ENCOUNTER — Inpatient Hospital Stay: Admitting: Internal Medicine

## 2024-05-26 ENCOUNTER — Inpatient Hospital Stay: Admitting: Licensed Clinical Social Worker

## 2024-05-26 VITALS — BP 143/78 | HR 82 | Temp 98.2°F | Resp 17 | Ht 73.0 in | Wt 150.0 lb

## 2024-05-26 DIAGNOSIS — R918 Other nonspecific abnormal finding of lung field: Secondary | ICD-10-CM

## 2024-05-26 DIAGNOSIS — Z79899 Other long term (current) drug therapy: Secondary | ICD-10-CM

## 2024-05-26 DIAGNOSIS — R079 Chest pain, unspecified: Secondary | ICD-10-CM | POA: Diagnosis not present

## 2024-05-26 DIAGNOSIS — C3490 Malignant neoplasm of unspecified part of unspecified bronchus or lung: Secondary | ICD-10-CM

## 2024-05-26 DIAGNOSIS — Z8249 Family history of ischemic heart disease and other diseases of the circulatory system: Secondary | ICD-10-CM

## 2024-05-26 DIAGNOSIS — R5383 Other fatigue: Secondary | ICD-10-CM | POA: Diagnosis not present

## 2024-05-26 DIAGNOSIS — M1711 Unilateral primary osteoarthritis, right knee: Secondary | ICD-10-CM | POA: Diagnosis not present

## 2024-05-26 DIAGNOSIS — F17210 Nicotine dependence, cigarettes, uncomplicated: Secondary | ICD-10-CM

## 2024-05-26 LAB — TROPONIN T, HIGH SENSITIVITY: Troponin T High Sensitivity: 17 ng/L (ref 0–19)

## 2024-05-26 MED ORDER — IOHEXOL 350 MG/ML SOLN
75.0000 mL | Freq: Once | INTRAVENOUS | Status: AC | PRN
  Administered 2024-05-26: 75 mL via INTRAVENOUS

## 2024-05-26 MED ORDER — AZITHROMYCIN 250 MG PO TABS
250.0000 mg | ORAL_TABLET | Freq: Every day | ORAL | 0 refills | Status: AC
  Filled 2024-05-26: qty 4, 4d supply, fill #0

## 2024-05-26 MED ORDER — ONDANSETRON 4 MG PO TBDP
4.0000 mg | ORAL_TABLET | Freq: Once | ORAL | Status: AC
  Administered 2024-05-26: 4 mg via ORAL
  Filled 2024-05-26: qty 1

## 2024-05-26 MED ORDER — AZITHROMYCIN 250 MG PO TABS
500.0000 mg | ORAL_TABLET | Freq: Once | ORAL | Status: AC
  Administered 2024-05-26: 500 mg via ORAL
  Filled 2024-05-26: qty 2

## 2024-05-26 NOTE — Progress Notes (Signed)
 [facility] Clinical Social Work  Clinical Social Work was referred by engineer, civil (consulting) for [facility] needs/ homelessness.  Clinical Social Worker met with patient to offer support and assess for needs.    Pt is homeless. Reports he was previously living with his daughter but she was evicted and he has been homeless for 8+ months. Pt does not have a phone and does not use e-mail. He has utilized the [facility] to make phone calls before but states they are otherwise not helpful.  Denies having family or friends who can assist with housing or with being a contact person for referrals.  He is in a wheelchair but states that he uses the bus to get around. Pt reports having food for today and declined food from [employer].   Interventions: Provided information on [facility] at [facility] for tonight along with bus passes and information on how to get there. Provided information on [employer] community housing studies and how to access list they maintain of available housing Called multiple shelters including [facility] and [facility] on pt's behalf to determine if beds available with no response.      Follow Up Plan:  CSW updated primary CSW S. Eder who will follow-up with medical team. Pt stated he will work on getting a phone and contact S. Eder once he has a working number    [employer], [employer]

## 2024-05-26 NOTE — Discharge Instructions (Addendum)
 It was our pleasure to provide your ER care today - we hope that you feel better.  Given your imaging results (density left lung) we have discussed your case, and made appointment for you to be seen today at our Oncology Clinic at [facility] at 2 PM today, arrive at 1:30 (or earlier) so that you can get registered for your appointment.   Avoid alcohol use as it is harmful to your physical health and mental well-being. See resource guide attached in terms of accessing inpatient or outpatient alcohol use treatment programs, as well as additional shelter and social service resources in the area.   For recent symptoms, follow up closely with primary care doctor and behavioral health provider in the coming week. Take antibiotic as prescribed.   For mental health issues and/or crisis, you may also go directly to the [facility] - they are open 24/7 and walk-ins are welcome.    Return to ER if worse, new symptoms, fevers, chest pain, trouble breathing, or other emergency concern.

## 2024-05-26 NOTE — Progress Notes (Signed)
 This NN met with the pt today at his/her consult appt with Dr. Sherrod. Pt was unaccompanied. 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  The plan for the pt is to complete staging work up with brain MRI and PET scan. NN scheduled PET and MRI on 1/9. Schedule printed out and given to pt.  Pt provided with bus passes and information for overnight housing provided by LCSW.  Pt requested numbers for alcohol rehab centers, which were provided to pt by Diane Bell, RN.  NN provided pt with direct contact information and encouraged pt call with any questions or concerns.  NN offered to show pt the way to the bus stop, however pt declined.

## 2024-05-26 NOTE — ED Provider Notes (Signed)
 " [place] EMERGENCY DEPARTMENT AT [facility] Provider Note   CSN: [id number] Arrival date & time: 05/25/24  2157     Patient presents with: Chest Pain   Albert Ellis is a 68 y.o. male.  {Add pertinent medical, surgical, social history, OB history to HPI:32947} Pt c/o pain to bilateral chest and right knee pain for past several weeks. Pt is a very poor historian - level 5 caveat.  Pt also noted hx etoh use disorder. Occasional non prod cough, not acutely worsening. +smoker. No fever, chills or sweats. No wt loss. Normal appetite. Denies sore throat, runny nose or other uri symptoms. No exertional chest pain. No pleuritic chest pain. Denies hx cad or known fam hx cad. No leg pain/swelling. No hx dvt or pe. No heartburn. Knee pain is chronic, without acute change, indicates has been told has arthritis in knee. Denies new redness or swelling. No leg swelling. No leg numbness/weakness.   The history is provided by the patient, medical records and the EMS personnel.  Chest Pain Associated symptoms: cough   Associated symptoms: no abdominal pain, no back pain, no fever, no nausea, no numbness, no palpitations, no shortness of breath, no vomiting and no weakness        Prior to Admission medications  Medication Sig Start Date End Date Taking? Authorizing Provider  amoxicillin -clavulanate (AUGMENTIN ) 875-125 MG tablet Take 1 tablet by mouth every 12 (twelve) hours. 05/07/24   Kingsley, Victoria K, DO  atorvastatin (LIPITOR) 20 MG tablet Take 20 mg by mouth daily at 6 PM.    [provider]  divalproex (DEPAKOTE) 500 MG DR tablet Take 500 mg by mouth 2 (two) times daily.    [provider]  hydrochlorothiazide (HYDRODIURIL) 25 MG tablet Take 25 mg by mouth daily.    [provider]  ondansetron  (ZOFRAN ) 4 MG tablet Take 1 tablet (4 mg total) by mouth every 8 (eight) hours as needed for up to 12 doses for nausea or vomiting. 05/07/23   Trifan, Donnice PARAS,  MD    Allergies: Patient has no known allergies.    Review of Systems  Constitutional:  Negative for appetite change, chills, fever and unexpected weight change.  HENT:  Negative for sore throat.   Eyes:  Negative for redness.  Respiratory:  Positive for cough. Negative for shortness of breath.   Cardiovascular:  Positive for chest pain. Negative for palpitations and leg swelling.  Gastrointestinal:  Negative for abdominal pain, nausea and vomiting.  Genitourinary:  Negative for flank pain.  Musculoskeletal:  Negative for back pain and neck pain.  Skin:  Negative for rash.  Neurological:  Negative for weakness and numbness.    Updated Vital Signs BP (!) 160/67 (BP Location: Right Arm)   Pulse 91   Temp 98.2 F (36.8 C) (Oral)   Resp 18   Ht 1.854 m (6' 1)   Wt 68 kg   SpO2 91%   BMI 19.79 kg/m   Physical Exam Vitals and nursing note reviewed.  Constitutional:      Appearance: Normal appearance. He is well-developed.  HENT:     Head: Atraumatic.     Comments: No sinu, mastoid, or temporal tenderness.     Nose: Nose normal.     Mouth/Throat:     Mouth: Mucous membranes are moist.     Pharynx: Oropharynx is clear.  Eyes:     General: No scleral icterus.    Conjunctiva/sclera: Conjunctivae normal.     Pupils:  Pupils are equal, round, and reactive to light.  Neck:     Trachea: No tracheal deviation.     Comments: Trachea midline. Thyroid not grossly enlarged or tender. No neck mass. No neck stiffness or rigidity.  Cardiovascular:     Rate and Rhythm: Normal rate and regular rhythm.     Pulses: Normal pulses.     Heart sounds: Normal heart sounds. No murmur heard.    No friction rub. No gallop.  Pulmonary:     Effort: Pulmonary effort is normal. No accessory muscle usage or respiratory distress.     Breath sounds: Normal breath sounds.     Comments: +mild chest wall tenderness reproducing symptoms. Normal chest movement, no splint. No crepitus.  Chest:     Chest  wall: Tenderness present.  Abdominal:     General: Bowel sounds are normal. There is no distension.     Palpations: Abdomen is soft. There is no mass.     Tenderness: There is no abdominal tenderness. There is no guarding.  Musculoskeletal:        General: No swelling or tenderness.     Cervical back: Normal range of motion and neck supple. No rigidity.     Right lower leg: No edema.     Left lower leg: No edema.     Comments: Good passive rom at right hip, knee and ankle without pain. No erythema or increased warmth to knee, no large effusion, knee grossly stable. RLE is of normal color and warmth with intact distal pulses.   Skin:    General: Skin is warm and dry.     Findings: No rash.  Neurological:     Mental Status: He is alert.     Comments: Alert, speech clear. Motor/sens grossly intact bil.   Psychiatric:        Mood and Affect: Mood normal.     (all labs ordered are listed, but only abnormal results are displayed) Results for orders placed or performed during the hospital encounter of 05/25/24  Basic metabolic panel   Collection Time: 05/25/24 10:12 PM  Result Value Ref Range   Sodium 138 135 - 145 mmol/L   Potassium 4.1 3.5 - 5.1 mmol/L   Chloride 103 98 - 111 mmol/L   CO2 25 22 - 32 mmol/L   Glucose, Bld 86 70 - 99 mg/dL   BUN 12 8 - 23 mg/dL   Creatinine, Ser 9.25 0.61 - 1.24 mg/dL   Calcium 9.2 8.9 - 89.6 mg/dL   GFR, Estimated >39 >39 mL/min   Anion gap 10 5 - 15  CBC   Collection Time: 05/25/24 10:12 PM  Result Value Ref Range   WBC 7.9 4.0 - 10.5 K/uL   RBC 5.39 4.22 - 5.81 MIL/uL   Hemoglobin 12.6 (L) 13.0 - 17.0 g/dL   HCT 60.8 60.9 - 47.9 %   MCV 72.5 (L) 80.0 - 100.0 fL   MCH 23.4 (L) 26.0 - 34.0 pg   MCHC 32.2 30.0 - 36.0 g/dL   RDW 83.6 (H) 88.4 - 84.4 %   Platelets 222 150 - 400 K/uL   nRBC 0.0 0.0 - 0.2 %  Troponin T, High Sensitivity   Collection Time: 05/25/24 10:12 PM  Result Value Ref Range   Troponin T High Sensitivity 21 (H) 0 - 19  ng/L  Troponin T, High Sensitivity   Collection Time: 05/26/24  9:00 AM  Result Value Ref Range   Troponin T High Sensitivity 17 0 -  19 ng/L   CT Chest W Contrast Result Date: 05/26/2024 EXAM: CT CHEST WITH CONTRAST 05/26/2024 09:56:17 AM TECHNIQUE: CT of the chest was performed with the administration of 75 mL of iohexol  (OMNIPAQUE ) 350 MG/ML injection. Multiplanar reformatted images are provided for review. Automated exposure control, iterative reconstruction, and/or weight based adjustment of the mA/kV was utilized to reduce the radiation dose to as low as reasonably achievable. COMPARISON: CT of the chest dated 04/30/2023. CLINICAL HISTORY: Cough, chronic/persisting > 8 weeks, failed empiric treatment; non prod cough, ?chest mass. r/o pneumonia, r/o lung cancer r/o other acute process. FINDINGS: MEDIASTINUM: Heart and pericardium are unremarkable. Moderate calcific coronary artery disease. Atheromatous disease within the thoracic aorta. The central airways are clear. LYMPH NODES: Left hilar lymphadenopathy present, measuring approximately 4.4 x 2.3 x 2.9 cm. No mediastinal or axillary lymphadenopathy. LUNGS AND PLEURA: Multilobular area of consolidation present anterolaterally within the left upper lobe, which measures approximately 4.4 x 2.7 x 3.9 cm. There is a satellite nodular lesion present more centrally within the left upper lobe. No pulmonary edema. No pleural effusion or pneumothorax. SOFT TISSUES/BONES: No acute abnormality of the bones or soft tissues. No osseous lesions present. UPPER ABDOMEN: Limited images of the upper abdomen demonstrates calcifications present within the mid and lower poles of the left kidney. IMPRESSION: 1. Multilobular area of consolidation in the left upper lobe with a satellite nodular lesion, new since the prior study; findings are suspicious for malignancy versus pneumonia. 2. Left hilar lymphadenopathy. 3. No evidence of metastatic disease in the chest. 4. Moderate  calcific coronary artery disease and atheromatous disease within the thoracic aorta. 5. Left renal calculi. Electronically signed by: Evalene Coho MD 05/26/2024 10:14 AM EST RP Workstation: [id number]   CT Head Wo Contrast Result Date: 05/26/2024 EXAM: CT HEAD WITHOUT CONTRAST 05/26/2024 09:56:17 AM TECHNIQUE: CT of the head was performed without the administration of intravenous contrast. Automated exposure control, iterative reconstruction, and/or weight based adjustment of the mA/kV was utilized to reduce the radiation dose to as low as reasonably achievable. COMPARISON: CT of the head dated 10/11/2018. CLINICAL HISTORY: Headache, increasing frequency or severity. FINDINGS: BRAIN AND VENTRICLES: No acute hemorrhage. No evidence of acute infarct. No hydrocephalus. No extra-axial collection. No mass effect or midline shift. Cerebral atrophy. Calcific atherosclerosis. ORBITS: No acute abnormality. SINUSES: Mucosal thickening in ethmoid air cells and maxillary sinuses. SOFT TISSUES AND SKULL: No acute soft tissue abnormality. No skull fracture. IMPRESSION: 1. No acute intracranial abnormality. Comparison is made to CT head dated 10/11/2018. 2. Cerebral atrophy. 3. Calcific atherosclerosis. 4. Mucosal thickening in ethmoid air cells and maxillary sinuses. Electronically signed by: Evalene Coho MD 05/26/2024 10:06 AM EST RP Workstation: [id number]   DG Chest 2 View Result Date: 05/25/2024 CLINICAL DATA:  Chest pain. EXAM: CHEST - 2 VIEW COMPARISON:  1125, CT 04/30/2023 FINDINGS: Mild cardiomegaly is stable. Unchanged mediastinal contours with aortic atherosclerosis. Persistent left perihilar opacity, slightly rounded. Peribronchial thickening is increased. No new airspace disease. No pneumothorax or pleural effusion. On limited assessment, no acute osseous abnormality. IMPRESSION: 1. Persistent left perihilar opacity, slightly rounded. Given persistence for nearly 3 weeks, recommend chest CT (with IV  contrast) to assess for potential pulmonary mass. 2. Increased peribronchial thickening, can be seen with bronchitis or asthma. 3. Stable cardiomegaly. Electronically Signed   By: Andrea Gasman M.D.   On: 05/25/2024 23:28   DG Chest Port 1 View Result Date: 05/06/2024 EXAM: 1 VIEW(S) XRAY OF THE CHEST 05/06/2024 09:51:22 PM COMPARISON: AP and  lateral chest 04/22/2024. CLINICAL HISTORY: cough cough FINDINGS: LUNGS AND PLEURA: There is increasing consolidation in the left upper lobe suprahilar area consistent with worsening pneumonia. Follow-up study recommended to ensure clearing after treatment. Remaining lungs are clear with COPD change. No pleural effusion. No pneumothorax. HEART AND MEDIASTINUM: There is mild cardiomegaly, normal caliber central vessels. The aorta is tortuous with atherosclerosis with stable mediastinum. BONES AND SOFT TISSUES: No acute osseous abnormality. There is a tangle of overlying monitor wires. IMPRESSION: 1. Increasing consolidation in the left upper lobe suprahilar region consistent with worsening pneumonia; follow-up imaging after treatment is recommended to document resolution. 2. Mild cardiomegaly. Electronically signed by: Francis Quam MD 05/06/2024 10:00 PM EST RP Workstation: [id number]     EKG: EKG Interpretation Date/Time:  Tuesday May 25 2024 22:17:11 EST Ventricular Rate:  67 PR Interval:  150 QRS Duration:  76 QT Interval:  410 QTC Calculation: 433 R Axis:   -50  Text Interpretation: Normal sinus rhythm Left anterior fascicular block Abnormal ECG When compared with ECG of 07-May-2024 21:59, PREVIOUS ECG IS PRESENT Confirmed by Elnor Hila ([id number]) on 05/25/2024 10:43:12 PM  Radiology: CT Chest W Contrast Result Date: 05/26/2024 EXAM: CT CHEST WITH CONTRAST 05/26/2024 09:56:17 AM TECHNIQUE: CT of the chest was performed with the administration of 75 mL of iohexol  (OMNIPAQUE ) 350 MG/ML injection. Multiplanar reformatted images are provided for  review. Automated exposure control, iterative reconstruction, and/or weight based adjustment of the mA/kV was utilized to reduce the radiation dose to as low as reasonably achievable. COMPARISON: CT of the chest dated 04/30/2023. CLINICAL HISTORY: Cough, chronic/persisting > 8 weeks, failed empiric treatment; non prod cough, ?chest mass. r/o pneumonia, r/o lung cancer r/o other acute process. FINDINGS: MEDIASTINUM: Heart and pericardium are unremarkable. Moderate calcific coronary artery disease. Atheromatous disease within the thoracic aorta. The central airways are clear. LYMPH NODES: Left hilar lymphadenopathy present, measuring approximately 4.4 x 2.3 x 2.9 cm. No mediastinal or axillary lymphadenopathy. LUNGS AND PLEURA: Multilobular area of consolidation present anterolaterally within the left upper lobe, which measures approximately 4.4 x 2.7 x 3.9 cm. There is a satellite nodular lesion present more centrally within the left upper lobe. No pulmonary edema. No pleural effusion or pneumothorax. SOFT TISSUES/BONES: No acute abnormality of the bones or soft tissues. No osseous lesions present. UPPER ABDOMEN: Limited images of the upper abdomen demonstrates calcifications present within the mid and lower poles of the left kidney. IMPRESSION: 1. Multilobular area of consolidation in the left upper lobe with a satellite nodular lesion, new since the prior study; findings are suspicious for malignancy versus pneumonia. 2. Left hilar lymphadenopathy. 3. No evidence of metastatic disease in the chest. 4. Moderate calcific coronary artery disease and atheromatous disease within the thoracic aorta. 5. Left renal calculi. Electronically signed by: Evalene Coho MD 05/26/2024 10:14 AM EST RP Workstation: [id number]   CT Head Wo Contrast Result Date: 05/26/2024 EXAM: CT HEAD WITHOUT CONTRAST 05/26/2024 09:56:17 AM TECHNIQUE: CT of the head was performed without the administration of intravenous contrast. Automated  exposure control, iterative reconstruction, and/or weight based adjustment of the mA/kV was utilized to reduce the radiation dose to as low as reasonably achievable. COMPARISON: CT of the head dated 10/11/2018. CLINICAL HISTORY: Headache, increasing frequency or severity. FINDINGS: BRAIN AND VENTRICLES: No acute hemorrhage. No evidence of acute infarct. No hydrocephalus. No extra-axial collection. No mass effect or midline shift. Cerebral atrophy. Calcific atherosclerosis. ORBITS: No acute abnormality. SINUSES: Mucosal thickening in ethmoid air cells and maxillary sinuses. SOFT TISSUES  AND SKULL: No acute soft tissue abnormality. No skull fracture. IMPRESSION: 1. No acute intracranial abnormality. Comparison is made to CT head dated 10/11/2018. 2. Cerebral atrophy. 3. Calcific atherosclerosis. 4. Mucosal thickening in ethmoid air cells and maxillary sinuses. Electronically signed by: Evalene Coho MD 05/26/2024 10:06 AM EST RP Workstation: [id number]   DG Chest 2 View Result Date: 05/25/2024 CLINICAL DATA:  Chest pain. EXAM: CHEST - 2 VIEW COMPARISON:  1125, CT 04/30/2023 FINDINGS: Mild cardiomegaly is stable. Unchanged mediastinal contours with aortic atherosclerosis. Persistent left perihilar opacity, slightly rounded. Peribronchial thickening is increased. No new airspace disease. No pneumothorax or pleural effusion. On limited assessment, no acute osseous abnormality. IMPRESSION: 1. Persistent left perihilar opacity, slightly rounded. Given persistence for nearly 3 weeks, recommend chest CT (with IV contrast) to assess for potential pulmonary mass. 2. Increased peribronchial thickening, can be seen with bronchitis or asthma. 3. Stable cardiomegaly. Electronically Signed   By: Andrea Gasman M.D.   On: 05/25/2024 23:28    {Document cardiac monitor, telemetry assessment procedure when appropriate:32947} Procedures   Medications Ordered in the ED  azithromycin [facility]) tablet 500 mg (has no  administration in time range)  ondansetron  (ZOFRAN -ODT) disintegrating tablet 4 mg (4 mg Oral Given 05/26/24 0204)  iohexol  (OMNIPAQUE ) 350 MG/ML injection 75 mL (75 mLs Intravenous Contrast Given 05/26/24 0956)      {Click here for ABCD2, HEART and other calculators REFRESH Note before signing:1}                              Medical Decision Making Amount and/or Complexity of Data Reviewed Labs: ordered. Radiology: ordered.  Risk Prescription drug management.   Iv ns. Continuous pulse ox and cardiac monitoring. Labs ordered/sent. Imaging ordered.   Differential diagnosis includes acs, msk cp, gi cp, pleurisy, pna, etc. Dispo decision including potential need for admission considered - will get labs and imaging and reassess.   Reviewed nursing notes and prior charts for additional history. External reports reviewed. Additional history from: EMS.  As pt c/o 'migraines' for 1 month, will  also add head imaging. Current denies any acute or severe head pain or other new neurologic symptoms.   Cardiac monitor: sinus rhythm, rate 88.  Labs reviewed/interpreted by me - wbc and hct normal. Chem normal. Trop 21. Pt refuses repeat troponin and/or additional labs.   Xrays reviewed/interpreted by me - ?left hilar opacity. Given SDOH challenges for patient, will get CT while in ED.   CT reviewed/interpreted by me - left lung densities, ?mass vs infil. Pt denies new or worsening cough. Denies fever or chills. Feel concern for possible lung mass/ca.  Given SDOH issues, I made heme/onc clinic consult and called RN navigator, discussed pt - they indicate they will make room for him to be seen today at [facility] heme/onc cancer center. TOC consulted to facilitate transport there.   Rec close pcp and heme/onc f/u - including today, discussed importance w pt.   Return precautions provided.    {Document critical care time when appropriate  Document review of labs and clinical decision tools ie CHADS2VASC2,  etc  Document your independent review of radiology images and any outside records  Document your discussion with family members, caretakers and with consultants  Document social determinants of health affecting pt's care  Document your decision making why or why not admission, treatments were needed:32947:::1}   Final diagnoses:  Precordial chest pain  Chronic pain of right knee  Osteoarthritis of right knee, unspecified osteoarthritis type  Alcohol use disorder  Housing instability  Lung density present on imaging study    ED Discharge Orders          Ordered    Ambulatory referral to Hematology / Oncology       Comments: RE:  lung mass/density r/o ca.  Your emergency department provider has referred you to see a hematology/oncology specialist. These are physicians who specialize in blood disorders and cancers, or findings concerning for cancer. You will receive a phone call from the [facility] Office to set up your appointment within 2 business days: [employer] operate Mon - Fri, 8:00 a.m. to 5:00 p.m.; closed for federally recognized holidays. Please be sure your phone is not set to block numbers during this time.   05/26/24 1034             "

## 2024-05-26 NOTE — Progress Notes (Signed)
 "   [facility] [phone]   [phone]  CONSULT NOTE  REFERRING PHYSICIAN: Franky Gaul, MD  REASON FOR CONSULTATION:  68 years old African-American male with highly suspicious lung cancer  HPI Albert Ellis is a 68 y.o. male.   HPI  Discussed Albert use of AI scribe software for clinical note transcription with Albert Ellis, who gave verbal consent to proceed.  History of Present Illness Albert Ellis is a 68 year old male with a highly suspicious left upper lobe lung mass and left hilar lymphadenopathy who presents for initial oncology evaluation.  Albert Ellis experienced left-sided chest pain for approximately two weeks prior to presentation, which prompted hospital evaluation. Albert Ellis denies hemoptysis, dyspnea, and unintentional weight loss. Albert Ellis notes occasional epistaxis but has not had hemoptysis. There are no headaches or visual changes. CT head without contrast did not show evidence of cancer spread to Albert brain.  Imaging revealed a left upper lobe lung mass measuring 4.4 x 2.7 x 3.9 cm and a left hilar lymph node measuring 4.4 x 2.3 x 2.9 cm.  Albert Ellis has chronic right knee pain due to osteoarthritis. Albert Ellis has a history of myocardial infarction and ischemic heart disease, but denies hypertension, diabetes, or cerebrovascular accident. Albert Ellis sometimes experiences vomiting but denies diarrhea.  Albert Ellis has a significant tobacco use history, having started at age 24 and resumed smoking six months ago. Albert Ellis drinks alcohol occasionally and has a remote history of cocaine use, last used over thirty years ago, with cessation of all drug use two months ago. Albert Ellis has been homeless for approximately six months and is currently unable to work due to weakness.  Family history significant for father and mother with heart disease. Albert Ellis is single and has 2 daughters.  Albert Ellis is currently homeless.  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    Past Medical History:  Diagnosis Date   Seizures  (HCC)       No past surgical history on file.  No family history on file.  Social History Social History[1]  Allergies[2]  Current Outpatient Medications  Medication Sig Dispense Refill   amoxicillin -clavulanate (AUGMENTIN ) 875-125 MG tablet Take 1 tablet by mouth every 12 (twelve) hours. 14 tablet 0   atorvastatin (LIPITOR) 20 MG tablet Take 20 mg by mouth daily at 6 PM.     [START ON 05/27/2024] azithromycin (ZITHROMAX Z-PAK) 250 MG tablet Take 1 tablet (250 mg total) by mouth daily for 4 days. 4 tablet 0   divalproex (DEPAKOTE) 500 MG DR tablet Take 500 mg by mouth 2 (two) times daily.     hydrochlorothiazide (HYDRODIURIL) 25 MG tablet Take 25 mg by mouth daily.     ondansetron  (ZOFRAN ) 4 MG tablet Take 1 tablet (4 mg total) by mouth every 8 (eight) hours as needed for up to 12 doses for nausea or vomiting. 12 tablet 0   No current facility-administered medications for this visit.    Review of Systems  Constitutional: positive for fatigue Eyes: negative Ears, nose, mouth, throat, and face: negative Respiratory: positive for pleurisy/chest pain Cardiovascular: negative Gastrointestinal: negative Genitourinary:negative Integument/breast: negative Hematologic/lymphatic: negative Musculoskeletal:positive for arthralgias Neurological: negative Behavioral/Psych: negative Endocrine: negative Allergic/Immunologic: negative  Physical Exam  MJO:jozmu, healthy, no distress, well nourished, and well developed SKIN: skin color, texture, turgor are normal, no rashes or significant lesions HEAD: Normocephalic, No masses, lesions, tenderness or abnormalities EYES: normal, PERRLA, Conjunctiva are pink and non-injected EARS: External ears normal, Canals clear OROPHARYNX:no exudate, no erythema, and lips, buccal  mucosa, and tongue normal  NECK: supple, no adenopathy, no JVD LYMPH:  no palpable lymphadenopathy, no hepatosplenomegaly LUNGS: clear to auscultation , and  palpation HEART: regular rate & rhythm, no murmurs, and no gallops ABDOMEN:abdomen soft, non-tender, normal bowel sounds, and no masses or organomegaly BACK: Back symmetric, no curvature., No CVA tenderness EXTREMITIES:no joint deformities, effusion, or inflammation, no edema  NEURO: alert & oriented x 3 with fluent speech, no focal motor/sensory deficits  PERFORMANCE STATUS: ECOG 2  LABORATORY DATA: Lab Results  Component Value Date   WBC 7.9 05/25/2024   HGB 12.6 (L) 05/25/2024   HCT 39.1 05/25/2024   MCV 72.5 (L) 05/25/2024   PLT 222 05/25/2024      Chemistry      Component Value Date/Time   NA 138 05/25/2024 2212   K 4.1 05/25/2024 2212   CL 103 05/25/2024 2212   CO2 25 05/25/2024 2212   BUN 12 05/25/2024 2212   CREATININE 0.74 05/25/2024 2212      Component Value Date/Time   CALCIUM 9.2 05/25/2024 2212   ALKPHOS 82 05/06/2024 2131   AST 24 05/06/2024 2131   ALT 17 05/06/2024 2131   BILITOT 0.3 05/06/2024 2131       RADIOGRAPHIC STUDIES: CT Chest W Contrast Result Date: 05/26/2024 EXAM: CT CHEST WITH CONTRAST 05/26/2024 09:56:17 AM TECHNIQUE: CT of Albert chest was performed with Albert administration of 75 mL of iohexol  (OMNIPAQUE ) 350 MG/ML injection. Multiplanar reformatted images are provided for review. Automated exposure control, iterative reconstruction, and/or weight based adjustment of the mA/kV was utilized to reduce Albert radiation dose to as low as reasonably achievable. COMPARISON: CT of Albert chest dated 04/30/2023. CLINICAL HISTORY: Cough, chronic/persisting > 8 weeks, failed empiric treatment; non prod cough, ?chest mass. r/o pneumonia, r/o lung cancer r/o other acute process. FINDINGS: MEDIASTINUM: Heart and pericardium are unremarkable. Moderate calcific coronary artery disease. Atheromatous disease within Albert thoracic aorta. Albert central airways are clear. LYMPH NODES: Left hilar lymphadenopathy present, measuring approximately 4.4 x 2.3 x 2.9 cm. No mediastinal  or axillary lymphadenopathy. LUNGS AND PLEURA: Multilobular area of consolidation present anterolaterally within Albert left upper lobe, which measures approximately 4.4 x 2.7 x 3.9 cm. There is a satellite nodular lesion present more centrally within Albert left upper lobe. No pulmonary edema. No pleural effusion or pneumothorax. SOFT TISSUES/BONES: No acute abnormality of Albert bones or soft tissues. No osseous lesions present. UPPER ABDOMEN: Limited images of Albert upper abdomen demonstrates calcifications present within Albert mid and lower poles of Albert left kidney. IMPRESSION: 1. Multilobular area of consolidation in Albert left upper lobe with a satellite nodular lesion, new since Albert prior study; findings are suspicious for malignancy versus pneumonia. 2. Left hilar lymphadenopathy. 3. No evidence of metastatic disease in Albert chest. 4. Moderate calcific coronary artery disease and atheromatous disease within Albert thoracic aorta. 5. Left renal calculi. Electronically signed by: Evalene Coho MD 05/26/2024 10:14 AM EST RP Workstation: [id number]   CT Head Wo Contrast Result Date: 05/26/2024 EXAM: CT HEAD WITHOUT CONTRAST 05/26/2024 09:56:17 AM TECHNIQUE: CT of Albert head was performed without Albert administration of intravenous contrast. Automated exposure control, iterative reconstruction, and/or weight based adjustment of the mA/kV was utilized to reduce Albert radiation dose to as low as reasonably achievable. COMPARISON: CT of Albert head dated 10/11/2018. CLINICAL HISTORY: Headache, increasing frequency or severity. FINDINGS: BRAIN AND VENTRICLES: No acute hemorrhage. No evidence of acute infarct. No hydrocephalus. No extra-axial collection. No mass effect or midline shift. Cerebral atrophy.  Calcific atherosclerosis. ORBITS: No acute abnormality. SINUSES: Mucosal thickening in ethmoid air cells and maxillary sinuses. SOFT TISSUES AND SKULL: No acute soft tissue abnormality. No skull fracture. IMPRESSION: 1. No acute  intracranial abnormality. Comparison is made to CT head dated 10/11/2018. 2. Cerebral atrophy. 3. Calcific atherosclerosis. 4. Mucosal thickening in ethmoid air cells and maxillary sinuses. Electronically signed by: Evalene Coho MD 05/26/2024 10:06 AM EST RP Workstation: [id number]   DG Chest 2 View Result Date: 05/25/2024 CLINICAL DATA:  Chest pain. EXAM: CHEST - 2 VIEW COMPARISON:  1125, CT 04/30/2023 FINDINGS: Mild cardiomegaly is stable. Unchanged mediastinal contours with aortic atherosclerosis. Persistent left perihilar opacity, slightly rounded. Peribronchial thickening is increased. No new airspace disease. No pneumothorax or pleural effusion. On limited assessment, no acute osseous abnormality. IMPRESSION: 1. Persistent left perihilar opacity, slightly rounded. Given persistence for nearly 3 weeks, recommend chest CT (with IV contrast) to assess for potential pulmonary mass. 2. Increased peribronchial thickening, can be seen with bronchitis or asthma. 3. Stable cardiomegaly. Electronically Signed   By: Andrea Gasman M.D.   On: 05/25/2024 23:28   DG Chest Port 1 View Result Date: 05/06/2024 EXAM: 1 VIEW(S) XRAY OF Albert CHEST 05/06/2024 09:51:22 PM COMPARISON: AP and lateral chest 04/22/2024. CLINICAL HISTORY: cough cough FINDINGS: LUNGS AND PLEURA: There is increasing consolidation in Albert left upper lobe suprahilar area consistent with worsening pneumonia. Follow-up study recommended to ensure clearing after treatment. Remaining lungs are clear with COPD change. No pleural effusion. No pneumothorax. HEART AND MEDIASTINUM: There is mild cardiomegaly, normal caliber central vessels. Albert aorta is tortuous with atherosclerosis with stable mediastinum. BONES AND SOFT TISSUES: No acute osseous abnormality. There is a tangle of overlying monitor wires. IMPRESSION: 1. Increasing consolidation in Albert left upper lobe suprahilar region consistent with worsening pneumonia; follow-up imaging after treatment  is recommended to document resolution. 2. Mild cardiomegaly. Electronically signed by: Francis Quam MD 05/06/2024 10:00 PM EST RP Workstation: [id number]    ASSESSMENT: This is a 68 years old homeless male with highly suspicious stage IIB (T2b, N1, M0) lung cancer likely non-small cell lung cancer pending tissue diagnosis and further staging workup.  Albert Ellis presented with left upper lobe lung mass in addition to left hilar lymphadenopathy on CT scan of Albert chest on May 26, 2024.   PLAN: Assessment and Plan Assessment & Plan Suspicious left upper lobe lung mass with left hilar lymphadenopathy Imaging demonstrated a large left upper lobe lung mass with left hilar lymphadenopathy, highly suspicious for lung carcinoma given his significant tobacco use and recent onset chest pain. No intracranial metastases identified on recent head CT. Malignancy remains unconfirmed; further diagnostic evaluation is required to establish histopathology and stage. - Ordered PET scan for metabolic characterization and staging. - Planned brain MRI to further evaluate for intracranial metastases. - Referred to pulmonology for tissue biopsy of Albert lung mass and/or hilar lymph node. - Coordinated diagnostic appointments and follow-up with lung coordinator. - Scheduled follow-up visit in one month post diagnostic workup. Albert Ellis was seen by Albert social worker today for evaluation of his condition and recommendation regarding residential stay.  Albert Ellis was also seen by Albert thoracic navigator. Albert Ellis was advised to call immediately if Albert Ellis has any other concerning symptoms in Albert interval.  Albert Ellis voices understanding of current disease status and treatment options and is in agreement with Albert current care plan.  All questions were answered. Albert Ellis knows to call Albert clinic with any problems, questions or concerns. We can certainly see  Albert Ellis much sooner if necessary.  Thank you so much for allowing me to  participate in Albert care of Albert Ellis. I will continue to follow up Albert Ellis with you and assist in his care. Albert total time spent in Albert appointment was 60 minutes including review of chart and various tests results, discussions about plan of care and coordination of care plan .   Disclaimer: This note was dictated with voice recognition software. Similar sounding words can inadvertently be transcribed and may not be corrected upon review.   Shakendra Griffeth K Glynnis Gavel May 26, 2024, 1:24 PM       [1]  Social History Tobacco Use   Smoking status: Former   Smokeless tobacco: Never  Substance Use Topics   Alcohol use: Not Currently  [2] No Known Allergies  "

## 2024-05-26 NOTE — ED Notes (Signed)
 Pt. Given a bag lunch, water, juice and crackers.

## 2024-06-03 NOTE — Progress Notes (Signed)
 NN reached out to pt's sister. No answer. Left VM requesting a return call.

## 2024-06-04 ENCOUNTER — Encounter (HOSPITAL_COMMUNITY): Attending: Internal Medicine

## 2024-06-04 ENCOUNTER — Encounter (HOSPITAL_COMMUNITY): Admission: RE | Admit: 2024-06-04 | Source: Ambulatory Visit

## 2024-06-10 ENCOUNTER — Other Ambulatory Visit (HOSPITAL_COMMUNITY): Payer: Self-pay

## 2024-06-16 NOTE — Progress Notes (Signed)
 NN reached out to pt's sister to see if she has been able to reach or see the pt. No answer. LVM with return number
